# Patient Record
Sex: Female | Born: 1985 | Race: Black or African American | Hispanic: No | Marital: Single | Smoking: Never smoker
Health system: Southern US, Community
[De-identification: ages and names within clinical notes are randomized; demographics above are authoritative.]

## PROBLEM LIST (undated history)

## (undated) DIAGNOSIS — D6859 Other primary thrombophilia: Secondary | ICD-10-CM

## (undated) DIAGNOSIS — I10 Essential (primary) hypertension: Secondary | ICD-10-CM

## (undated) DIAGNOSIS — J45909 Unspecified asthma, uncomplicated: Secondary | ICD-10-CM

## (undated) HISTORY — PX: NO PAST SURGERIES: SHX2092

## (undated) HISTORY — PX: TUBAL LIGATION: SHX77

---
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
## 2012-07-31 DIAGNOSIS — D6859 Other primary thrombophilia: Secondary | ICD-10-CM | POA: Insufficient documentation

## 2013-06-10 DIAGNOSIS — J4520 Mild intermittent asthma, uncomplicated: Secondary | ICD-10-CM | POA: Insufficient documentation

## 2014-08-02 DIAGNOSIS — I10 Essential (primary) hypertension: Secondary | ICD-10-CM | POA: Insufficient documentation

## 2014-08-29 DIAGNOSIS — O1490 Unspecified pre-eclampsia, unspecified trimester: Secondary | ICD-10-CM

## 2016-02-16 DIAGNOSIS — Z6841 Body Mass Index (BMI) 40.0 and over, adult: Secondary | ICD-10-CM | POA: Insufficient documentation

## 2016-12-11 DIAGNOSIS — G43009 Migraine without aura, not intractable, without status migrainosus: Secondary | ICD-10-CM | POA: Insufficient documentation

## 2016-12-17 DIAGNOSIS — G4733 Obstructive sleep apnea (adult) (pediatric): Secondary | ICD-10-CM | POA: Insufficient documentation

## 2017-10-08 DIAGNOSIS — R3129 Other microscopic hematuria: Secondary | ICD-10-CM | POA: Insufficient documentation

## 2018-03-14 DIAGNOSIS — E876 Hypokalemia: Secondary | ICD-10-CM | POA: Insufficient documentation

## 2018-03-27 DIAGNOSIS — Z8659 Personal history of other mental and behavioral disorders: Secondary | ICD-10-CM | POA: Insufficient documentation

## 2018-04-04 DIAGNOSIS — Z6791 Unspecified blood type, Rh negative: Secondary | ICD-10-CM | POA: Insufficient documentation

## 2018-04-04 DIAGNOSIS — O26899 Other specified pregnancy related conditions, unspecified trimester: Secondary | ICD-10-CM | POA: Insufficient documentation

## 2018-05-13 DIAGNOSIS — O99119 Other diseases of the blood and blood-forming organs and certain disorders involving the immune mechanism complicating pregnancy, unspecified trimester: Secondary | ICD-10-CM | POA: Insufficient documentation

## 2018-05-13 DIAGNOSIS — Z8742 Personal history of other diseases of the female genital tract: Secondary | ICD-10-CM | POA: Insufficient documentation

## 2018-05-13 DIAGNOSIS — O99281 Endocrine, nutritional and metabolic diseases complicating pregnancy, first trimester: Secondary | ICD-10-CM | POA: Insufficient documentation

## 2018-05-13 DIAGNOSIS — J45909 Unspecified asthma, uncomplicated: Secondary | ICD-10-CM | POA: Insufficient documentation

## 2018-05-13 DIAGNOSIS — Z9151 Personal history of suicidal behavior: Secondary | ICD-10-CM | POA: Insufficient documentation

## 2018-05-13 DIAGNOSIS — O09291 Supervision of pregnancy with other poor reproductive or obstetric history, first trimester: Secondary | ICD-10-CM | POA: Insufficient documentation

## 2018-05-13 DIAGNOSIS — Z8619 Personal history of other infectious and parasitic diseases: Secondary | ICD-10-CM | POA: Insufficient documentation

## 2018-05-13 DIAGNOSIS — O10919 Unspecified pre-existing hypertension complicating pregnancy, unspecified trimester: Secondary | ICD-10-CM | POA: Insufficient documentation

## 2018-05-13 DIAGNOSIS — Z6791 Unspecified blood type, Rh negative: Secondary | ICD-10-CM | POA: Insufficient documentation

## 2018-05-13 DIAGNOSIS — E039 Hypothyroidism, unspecified: Secondary | ICD-10-CM | POA: Insufficient documentation

## 2018-05-13 DIAGNOSIS — O09891 Supervision of other high risk pregnancies, first trimester: Secondary | ICD-10-CM | POA: Insufficient documentation

## 2018-05-21 DIAGNOSIS — Z315 Encounter for genetic counseling: Secondary | ICD-10-CM | POA: Insufficient documentation

## 2018-09-26 ENCOUNTER — Encounter: Payer: Self-pay | Admitting: Emergency Medicine

## 2018-09-26 DIAGNOSIS — O26893 Other specified pregnancy related conditions, third trimester: Secondary | ICD-10-CM | POA: Insufficient documentation

## 2018-09-26 DIAGNOSIS — E860 Dehydration: Secondary | ICD-10-CM | POA: Diagnosis present

## 2018-09-26 DIAGNOSIS — J45909 Unspecified asthma, uncomplicated: Secondary | ICD-10-CM | POA: Insufficient documentation

## 2018-09-26 DIAGNOSIS — O212 Late vomiting of pregnancy: Secondary | ICD-10-CM | POA: Diagnosis present

## 2018-09-26 DIAGNOSIS — O99513 Diseases of the respiratory system complicating pregnancy, third trimester: Secondary | ICD-10-CM | POA: Insufficient documentation

## 2018-09-26 DIAGNOSIS — D6859 Other primary thrombophilia: Secondary | ICD-10-CM | POA: Diagnosis not present

## 2018-09-26 DIAGNOSIS — Z3A30 30 weeks gestation of pregnancy: Secondary | ICD-10-CM | POA: Insufficient documentation

## 2018-09-26 DIAGNOSIS — O163 Unspecified maternal hypertension, third trimester: Secondary | ICD-10-CM | POA: Diagnosis not present

## 2018-09-26 LAB — COMPREHENSIVE METABOLIC PANEL
ALT: 22 U/L (ref 0–44)
AST: 24 U/L (ref 15–41)
Albumin: 3.2 g/dL — ABNORMAL LOW (ref 3.5–5.0)
Alkaline Phosphatase: 95 U/L (ref 38–126)
Anion gap: 10 (ref 5–15)
BUN: 5 mg/dL — ABNORMAL LOW (ref 6–20)
CO2: 18 mmol/L — ABNORMAL LOW (ref 22–32)
Calcium: 9 mg/dL (ref 8.9–10.3)
Chloride: 107 mmol/L (ref 98–111)
Creatinine, Ser: 0.5 mg/dL (ref 0.44–1.00)
GFR calc Af Amer: >60 mL/min (ref >60)
GFR calc non Af Amer: >60 mL/min (ref >60)
Glucose, Bld: 114 mg/dL — ABNORMAL HIGH (ref 70–99)
Potassium: 2.9 mmol/L — ABNORMAL LOW (ref 3.5–5.1)
Sodium: 135 mmol/L (ref 135–145)
Total Bilirubin: 1.2 mg/dL (ref 0.3–1.2)
Total Protein: 6.9 g/dL (ref 6.5–8.1)

## 2018-09-26 LAB — CBC
HCT: 35.5 % — ABNORMAL LOW (ref 36.0–46.0)
Hemoglobin: 12.2 g/dL (ref 12.0–15.0)
MCH: 30.3 pg (ref 26.0–34.0)
MCHC: 34.4 g/dL (ref 30.0–36.0)
MCV: 88.1 fL (ref 80.0–100.0)
Platelets: 329 10*3/uL (ref 150–400)
RBC: 4.03 MIL/uL (ref 3.87–5.11)
RDW: 12.6 % (ref 11.5–15.5)
WBC: 7.8 10*3/uL (ref 4.0–10.5)
nRBC: 0 % (ref 0.0–0.2)

## 2018-09-26 LAB — URINALYSIS, COMPLETE (UACMP) WITH MICROSCOPIC
Bilirubin Urine: NEGATIVE
Glucose, UA: NEGATIVE mg/dL
Ketones, ur: 20 mg/dL — AB
Leukocytes,Ua: NEGATIVE
Nitrite: NEGATIVE
Protein, ur: 30 mg/dL — AB
Specific Gravity, Urine: 1.026 (ref 1.005–1.030)
pH: 5 (ref 5.0–8.0)

## 2018-09-26 LAB — LIPASE, BLOOD: LIPASE: 20 U/L (ref 11–51)

## 2018-09-26 NOTE — ED Notes (Signed)
Patient ambulatory to stat desk without distress or difficulty.  Reports here because having diarrhea.  Patient is [redacted] weeks pregnant, has no pregnancy complaint.

## 2018-09-26 NOTE — ED Triage Notes (Signed)
Patient c/o N/V/D beginning last night. Patient reports multiple emeses and liquid stool. Patient is pregnant, EDD is 5/8.

## 2018-09-26 NOTE — ED Notes (Signed)
This RN spoke with L&D. After patient is medically cleared she is to be sent to Obs 2, and will see MD Stabler.

## 2018-09-27 ENCOUNTER — Observation Stay
Admission: EM | Admit: 2018-09-27 | Discharge: 2018-09-27 | Disposition: A | Discharge status: Home / Self Care | Payer: BC Managed Care – PPO | Attending: Certified Nurse Midwife | Admitting: Certified Nurse Midwife

## 2018-09-27 ENCOUNTER — Other Ambulatory Visit: Payer: Self-pay

## 2018-09-27 ENCOUNTER — Encounter: Payer: Self-pay | Admitting: *Deleted

## 2018-09-27 DIAGNOSIS — R102 Pelvic and perineal pain unspecified side: Secondary | ICD-10-CM | POA: Diagnosis present

## 2018-09-27 DIAGNOSIS — O26893 Other specified pregnancy related conditions, third trimester: Secondary | ICD-10-CM

## 2018-09-27 DIAGNOSIS — Z3A30 30 weeks gestation of pregnancy: Secondary | ICD-10-CM | POA: Diagnosis not present

## 2018-09-27 DIAGNOSIS — O212 Late vomiting of pregnancy: Secondary | ICD-10-CM | POA: Diagnosis not present

## 2018-09-27 DIAGNOSIS — R197 Diarrhea, unspecified: Secondary | ICD-10-CM

## 2018-09-27 DIAGNOSIS — E860 Dehydration: Secondary | ICD-10-CM

## 2018-09-27 DIAGNOSIS — N949 Unspecified condition associated with female genital organs and menstrual cycle: Secondary | ICD-10-CM | POA: Diagnosis present

## 2018-09-27 DIAGNOSIS — E876 Hypokalemia: Secondary | ICD-10-CM

## 2018-09-27 DIAGNOSIS — R112 Nausea with vomiting, unspecified: Secondary | ICD-10-CM

## 2018-09-27 DIAGNOSIS — O10913 Unspecified pre-existing hypertension complicating pregnancy, third trimester: Secondary | ICD-10-CM | POA: Diagnosis not present

## 2018-09-27 HISTORY — DX: Unspecified asthma, uncomplicated: J45.909

## 2018-09-27 HISTORY — DX: Other primary thrombophilia: D68.59

## 2018-09-27 HISTORY — DX: Essential (primary) hypertension: I10

## 2018-09-27 MED ORDER — ONDANSETRON HCL 4 MG/2ML IJ SOLN
4.0000 mg | Freq: Once | INTRAMUSCULAR | Status: AC
  Administered 2018-09-27: 4 mg via INTRAVENOUS
  Filled 2018-09-27: qty 2

## 2018-09-27 MED ORDER — POTASSIUM CHLORIDE 20 MEQ PO PACK
40.0000 meq | PACK | Freq: Once | ORAL | Status: AC
  Administered 2018-09-27: 40 meq via ORAL

## 2018-09-27 MED ORDER — FAMOTIDINE 20 MG PO TABS: 20.0000 mg | ORAL_TABLET | Freq: Two times a day (BID) | ORAL | 0 refills | Status: DC | PRN

## 2018-09-27 MED ORDER — POTASSIUM CHLORIDE CRYS ER 20 MEQ PO TBCR
40.0000 meq | EXTENDED_RELEASE_TABLET | Freq: Once | ORAL | Status: DC
  Filled 2018-09-27: qty 2

## 2018-09-27 MED ORDER — SODIUM CHLORIDE 0.9 % IV BOLUS: 1000.0000 mL | Freq: Once | INTRAVENOUS | Status: DC

## 2018-09-27 MED ORDER — SODIUM CHLORIDE 0.9 % IV BOLUS
1000.0000 mL | Freq: Once | INTRAVENOUS | Status: AC
  Administered 2018-09-27: 1000 mL via INTRAVENOUS

## 2018-09-27 MED ORDER — FAMOTIDINE 20 MG PO TABS
20.0000 mg | ORAL_TABLET | Freq: Two times a day (BID) | ORAL | Status: DC | PRN
  Administered 2018-09-27: 20 mg via ORAL
  Filled 2018-09-27: qty 1

## 2018-09-27 MED ORDER — DEXTROSE-NACL 5-0.45 % IV SOLN
Freq: Once | INTRAVENOUS | Status: AC
  Administered 2018-09-27: 04:00:00 via INTRAVENOUS

## 2018-09-27 MED ORDER — ONDANSETRON 4 MG PO TBDP: 4.0000 mg | ORAL_TABLET | Freq: Three times a day (TID) | ORAL | 0 refills | Status: AC | PRN

## 2018-09-27 MED ORDER — METRONIDAZOLE 0.75 % VA GEL: VAGINAL | 0 refills | Status: DC

## 2018-09-27 NOTE — Discharge Instructions (Addendum)
1.  You may take Zofran as needed for nausea/vomiting. 2.  Clear liquids x12 hours, then [employer] x3 days, then slowly advance diet as tolerated. 3.  Return to the ER for worsening symptoms, persistent vomiting, abdominal pain, vaginal bleeding, difficulty breathing or other concerns. 4. Call your OB/GYN and ask them to schedule a NST within the next week.

## 2018-09-27 NOTE — Final Progress Note (Signed)
Physician Final Progress Note  Patient ID: Catherine Waters MRN: [id number] DOB/AGE: 31/22/1987 31 y.o.  Admit date: 09/27/2018 Admitting provider: Gae Dry, MD/ Jesus Genera. Danise Mina, CNM Discharge date: 09/27/2018   Admission Diagnoses: Pelvic pressure at 30wk1d S/p treatment for nausea/vomiting/diarrhea in the ED CHTN in pregnancy Protein C deficiency Discharge Diagnoses: IUP at 30wk1d Pelvic pressure in the third trimester-resolved FHR deceleration-reactive NST with normal AFI  Consults: None  Significant Findings/ Diagnostic Studies: Catherine Waters is a 33 y.o. female G2, P60 with EDC=12/05/2018 who presented at [redacted] weeks gestation to the ED from home with a chief complaint of nausea/vomiting/diarrhea. She received 2 liters of fluid and 4 mgm Zofran IV as well as oral KCL for mild hypokalemia. She was then transported to L&D for evaluation of pelvic pressure she had complained of last night in the ER.  Prenatal care at [facility] remarkable for asthma, chronic hypertension ( on Procardia 60 mgm XL), Protein C deficiency, morbid obesity, RH negative (had Rhogam 9/3 due to vaginal bleeding and on 2/10)  and sleep apnea.  IN L&D patient reported that the pelvic pressure resolved when the vomiting stopped. Denied any vaginal bleeding or leakage of water. Baby has been active. Does currently have some heartburn She reports not being able to keep down the Flagyl she was prescribed for treatment of BV.  Exam: BP 119/87 (BP Location: Left Arm)   Pulse (!) 110   Temp (!) 97.5 F (36.4 C) (Oral)   Resp 16   Ht 5\' 3"  (1.6 m)   Wt 108.9 kg   SpO2 97%   BMI 42.51 kg/m   General: [name], appears tired, but in NAD Abdomen: soft, NT. gravid There was a deceleration of the fetal heart rate from the baseline of 150 to 70 BPM x 60 sec that was unprovoked. An AFI was 9.6cm. The fetal heart rate was monitored for another 2 hours. The tracing was reactive with a baseline of 145 and accelerations to  160s with moderate variability. There were no further decelerations seen. Toco: There was some irritability noted initially, which resolved with po hydration.  Cervix was closed/long/OOP.  She was able to tolerate clear liquids and had 2 liquid stools.  Patient was discharged home on clear liquids to advance to BRAT diet then diet as tolerated Recommend following up with her OB/GYN for a NST next week.  RX for Pepcid 20 mgm BID prn Metrogel-one applicatorful vaginally qhs x 5 She also has an RX for Zofran from the ER   Procedures: AFI On ultrasound: Presentation: cephalic Placenta: anterior Stomach and bladder visualized AFI: 30.62mm +13.31mm+19.3mm+32.2 mm=95.63mm  Discharge Condition: improved/ stable  Disposition: Discharge disposition: 01-Home or Self Care       Diet: Clear liquid diet, then advance to a BRAT diet x 3 days then advance as tolerated  Discharge Activity: Activity as tolerated   Allergies as of 09/27/2018      Reactions   Minocycline       Medication List    STOP taking these medications   metroNIDAZOLE 500 MG tablet Commonly known as:  FLAGYL     TAKE these medications   albuterol 108 (90 Base) MCG/ACT inhaler Commonly known as:  PROVENTIL HFA;VENTOLIN HFA Inhale into the lungs every 6 (six) hours as needed for wheezing or shortness of breath.   aspirin EC 81 MG tablet Take 81 mg by mouth daily.   famotidine 20 MG tablet Commonly known as:  PEPCID Take 1 tablet (20  mg total) by mouth 2 (two) times daily as needed for heartburn or indigestion.   loratadine 10 MG tablet Commonly known as:  CLARITIN Take 10 mg by mouth daily.   metroNIDAZOLE 0.75 % vaginal gel Commonly known as:  METROGEL Insert one applicatorful qhs x 5days   multivitamin-prenatal 27-0.8 MG Tabs tablet Take 1 tablet by mouth daily at 12 noon.   NIFEdipine 60 MG 24 hr tablet Commonly known as:  ADALAT CC Take 60 mg by mouth daily.   ondansetron 4 MG disintegrating  tablet Commonly known as:  ZOFRAN ODT Take 1 tablet (4 mg total) by mouth every 8 (eight) hours as needed for nausea or vomiting.      Follow-up Information    Your OB/GYN. Schedule an appointment as soon as possible for a visit in 1 week(s).   Why:  recommend having a non stress test          Total time spent taking care of this patient: 20 minutes  Signed: Dalia Heading 09/27/2018, 10:56 AM

## 2018-09-27 NOTE — ED Notes (Signed)
IV will remain in when patient transfer to L&D for fetal monitoring.

## 2018-09-27 NOTE — OB Triage Note (Signed)
Discharged home. Left floor ambulatory. Catherine Waters

## 2018-09-27 NOTE — OB Triage Note (Addendum)
"  Pelvic pain" started Friday around 1100 am. Currently not feeling any pelvic pain. Reports good fetal movement. Denies vaginal bleeding. Denies LOF. Reports "yeasty like discharge" Denies vaginal itching. Catherine Waters

## 2018-09-27 NOTE — ED Provider Notes (Signed)
[facility] Emergency Department Provider Note   ____________________________________________   First MD Initiated Contact with Patient 09/27/18 [phone]     (approximate)  I have reviewed the triage vital signs and the nursing notes.   HISTORY  Chief Complaint Diarrhea and Emesis    HPI Catherine Waters is a 33 y.o. female G2, P1 approximately [redacted] weeks pregnant followed at [facility] who presents to the ED from home with a chief complaint of nausea/vomiting/diarrhea.  Symptoms onset 2 nights ago.  Patient reports multiple episodes of vomiting and diarrhea.   Last emesis around 3 PM.  Last diarrhea prior to arrival.  Denies abdominal pain or vaginal bleeding.  Denies associated fever, chills, chest pain, shortness of breath, dysuria.  Denies recent travel, trauma or antibiotic use.  Chart review indicates patient was seen at outside emergency department on 09/17/2022 viral syndrome.  She tested negative for influenza.   Past Medical History:  Diagnosis Date  . Asthma   . Hypertension     There are no active problems to display for this patient.   History reviewed. No pertinent surgical history.  Prior to Admission medications   Medication Sig Start Date End Date Taking? Authorizing Provider  ondansetron (ZOFRAN ODT) 4 MG disintegrating tablet Take 1 tablet (4 mg total) by mouth every 8 (eight) hours as needed for nausea or vomiting. 09/27/18   Paulette Blanch, MD    Allergies Minocycline  No family history on file.  Social History Social History   Tobacco Use  . Smoking status: Never Smoker  . Smokeless tobacco: Never Used  Substance Use Topics  . Alcohol use: Not Currently  . Drug use: Not on file    Review of Systems  Constitutional: No fever/chills Eyes: No visual changes. ENT: No sore throat. Cardiovascular: Denies chest pain. Respiratory: Denies shortness of breath. Gastrointestinal: No abdominal pain.  Positive for nausea, vomiting and  diarrhea.  No constipation. Genitourinary: Negative for dysuria. Musculoskeletal: Negative for back pain. Skin: Negative for rash. Neurological: Negative for headaches, focal weakness or numbness.   ____________________________________________   PHYSICAL EXAM:  VITAL SIGNS: ED Triage Vitals  Enc Vitals Group     BP 09/26/18 2238 122/88     Pulse Rate 09/26/18 2238 (!) 140     Resp 09/26/18 2238 20     Temp 09/26/18 2238 99.1 F (37.3 C)     Temp Source 09/26/18 2238 Oral     SpO2 09/26/18 2238 98 %     Weight 09/26/18 2242 240 lb (108.9 kg)     Height 09/26/18 2242 5\' 3"  (1.6 m)     Head Circumference --      Peak Flow --      Pain Score 09/26/18 2239 10     Pain Loc --      Pain Edu? --      Excl. in [place]? --     Constitutional: Alert and oriented. Well appearing and in no acute distress. Eyes: Conjunctivae are normal. PERRL. EOMI. Head: Atraumatic. Nose: No congestion/rhinnorhea. Mouth/Throat: Mucous membranes are mildly dry.  Oropharynx non-erythematous. Neck: No stridor.   Cardiovascular: Normal rate, regular rhythm. Grossly normal heart sounds.  Good peripheral circulation. Respiratory: Normal respiratory effort.  No retractions. Lungs CTAB. Gastrointestinal: Gravid.  Soft and nontender to light or deep palpation. No distention. No abdominal bruits. No CVA tenderness. Musculoskeletal: No lower extremity tenderness nor edema.  No joint effusions. Neurologic:  Normal speech and language. No gross focal neurologic  deficits are appreciated. No gait instability. Skin:  Skin is warm, dry and intact. No rash noted. Psychiatric: Mood and affect are normal. Speech and behavior are normal.  ____________________________________________   LABS (all labs ordered are listed, but only abnormal results are displayed)  Labs Reviewed  COMPREHENSIVE METABOLIC PANEL - Abnormal; Notable for the following components:      Result Value   Potassium 2.9 (*)    CO2 18 (*)    Glucose,  Bld 114 (*)    BUN 5 (*)    Albumin 3.2 (*)    All other components within normal limits  CBC - Abnormal; Notable for the following components:   HCT 35.5 (*)    All other components within normal limits  URINALYSIS, COMPLETE (UACMP) WITH MICROSCOPIC - Abnormal; Notable for the following components:   Color, Urine AMBER (*)    APPearance CLEAR (*)    Hgb urine dipstick MODERATE (*)    Ketones, ur 20 (*)    Protein, ur 30 (*)    Bacteria, UA FEW (*)    All other components within normal limits  LIPASE, BLOOD   ____________________________________________  EKG  None ____________________________________________  RADIOLOGY  ED MD interpretation: None  Official radiology report(s): No results found.  ____________________________________________   PROCEDURES  Procedure(s) performed (including Critical Care):  Procedures   ____________________________________________   INITIAL IMPRESSION / ASSESSMENT AND PLAN / ED COURSE  As part of my medical decision making, I reviewed the following data within the [facility] notes reviewed and incorporated, Labs reviewed, Old chart reviewed and Notes from prior ED visits    33 year old female who is [redacted] weeks pregnant who presents with nausea/vomiting/diarrhea.  Laboratory results remarkable for mild hypokalemia and dehydration.  Will initiate D5 one half normal saline infusion, 4 mg IV Zofran for nausea and reassess.  We will try to replete potassium orally if possible.  Of note, patient will be going upstairs for baby monitoring after cleared from the ED.   Clinical Course as of Feb 29 0658  Sat Sep 27, 2018  0558 She feeling better.  Able to tolerate oral potassium.  Heart rate improving.  Will administer second liter IV fluid bolus before patient heads up to L&D.  Fetal heart rate 150s.   [JS]  8768 Second liter IV fluids infusing.  Patient may go up to L&D for fetal monitoring after this is finished.   Care signed out to Dr. Cinda Quest pending reassessment.   [JS]    Clinical Course User Index [JS] Paulette Blanch, MD     ____________________________________________   FINAL CLINICAL IMPRESSION(S) / ED DIAGNOSES  Final diagnoses:  Dehydration  Nausea vomiting and diarrhea  [redacted] weeks gestation of pregnancy  Hypokalemia     ED Discharge Orders         Ordered    ondansetron (ZOFRAN ODT) 4 MG disintegrating tablet  Every 8 hours PRN     09/27/18 0646           Note:  This document was prepared using Dragon voice recognition software and may include unintentional dictation errors.   Paulette Blanch, MD 09/27/18 [phone]

## 2019-02-27 ENCOUNTER — Encounter (HOSPITAL_COMMUNITY): Payer: Self-pay

## 2019-04-29 ENCOUNTER — Other Ambulatory Visit: Payer: Self-pay | Admitting: Certified Nurse Midwife

## 2020-02-15 ENCOUNTER — Other Ambulatory Visit: Payer: Self-pay

## 2020-02-15 ENCOUNTER — Ambulatory Visit
Admission: RE | Admit: 2020-02-15 | Discharge: 2020-02-15 | Disposition: A | Discharge status: Home / Self Care | Payer: BC Managed Care – PPO | Source: Ambulatory Visit | Attending: Family Medicine | Admitting: Family Medicine

## 2020-02-15 VITALS — BP 153/106 | HR 95 | Temp 98.6°F | Resp 18 | Ht 63.0 in | Wt 240.0 lb

## 2020-02-15 DIAGNOSIS — N898 Other specified noninflammatory disorders of vagina: Secondary | ICD-10-CM | POA: Insufficient documentation

## 2020-02-15 LAB — POCT URINALYSIS DIP (MANUAL ENTRY)
Bilirubin, UA: NEGATIVE
Glucose, UA: NEGATIVE mg/dL
Ketones, POC UA: NEGATIVE mg/dL
Nitrite, UA: NEGATIVE
Protein Ur, POC: NEGATIVE mg/dL
Spec Grav, UA: 1.02 (ref 1.010–1.025)
Urobilinogen, UA: 0.2 E.U./dL
pH, UA: 6.5 (ref 5.0–8.0)

## 2020-02-15 MED ORDER — FLUCONAZOLE 200 MG PO TABS: 200.0000 mg | ORAL_TABLET | Freq: Once | ORAL | 0 refills | Status: AC

## 2020-02-15 NOTE — Discharge Instructions (Addendum)
I have sent in fluconazole for you to take for yeast. Take one tablet today, and if you are still having symptoms in 3 days, take the second tablet  Follow up as needed with primary care

## 2020-02-15 NOTE — ED Provider Notes (Signed)
[facility]   [id number] 02/15/20 Arrival Time: 0900   CC: VAGINAL DISCHARGE  SUBJECTIVE:  Catherine Waters is a 34 y.o. female who presents with complaints of gradual white vaginal discharge that began about 5 days ago.  Reports that she took antibiotics about 2 weeks ago.  Reports that every time she takes antibiotic, she gets yeast infection.  Reports that her vagina is also been a bit irritated.  Has not taken OTC medications for this.  Denies any odor. She denies fever, chills, nausea, vomiting, abdominal or pelvic pain, urinary symptoms, vaginal itching, vaginal odor, vaginal bleeding, dyspareunia, vaginal rashes or lesions.   Patient's last menstrual period was 01/20/2020.   ROS: As per HPI.  All other pertinent ROS negative.     Past Medical History:  Diagnosis Date  . Asthma   . Hypertension   . Protein C deficiency [facility])    Past Surgical History:  Procedure Laterality Date  . NO PAST SURGERIES    . TUBAL LIGATION     Allergies  Allergen Reactions  . Minocycline    No current facility-administered medications on file prior to encounter.   Current Outpatient Medications on File Prior to Encounter  Medication Sig Dispense Refill  . albuterol (PROVENTIL HFA;VENTOLIN HFA) 108 (90 Base) MCG/ACT inhaler Inhale into the lungs every 6 (six) hours as needed for wheezing or shortness of breath.    Marland Kitchen aspirin EC 81 MG tablet Take 81 mg by mouth daily.    . famotidine (PEPCID) 20 MG tablet Take 1 tablet (20 mg total) by mouth 2 (two) times daily as needed for heartburn or indigestion. 60 tablet 0  . loratadine (CLARITIN) 10 MG tablet Take 10 mg by mouth daily.    . metroNIDAZOLE (METROGEL) 0.75 % vaginal gel Insert one applicatorful qhs x 5days 70 g 0  . NIFEdipine (ADALAT CC) 60 MG 24 hr tablet Take 60 mg by mouth daily.    . ondansetron (ZOFRAN ODT) 4 MG disintegrating tablet Take 1 tablet (4 mg total) by mouth every 8 (eight) hours as needed for nausea or vomiting. 20  tablet 0  . Prenatal Vit-Fe Fumarate-FA (MULTIVITAMIN-PRENATAL) 27-0.8 MG TABS tablet Take 1 tablet by mouth daily at 12 noon.      Social History   Socioeconomic History  . Marital status: Single    Spouse name: Not on file  . Number of children: Not on file  . Years of education: Not on file  . Highest education level: Not on file  Occupational History  . Not on file  Tobacco Use  . Smoking status: Never Smoker  . Smokeless tobacco: Never Used  Substance and Sexual Activity  . Alcohol use: Not Currently  . Drug use: Never  . Sexual activity: Yes    Birth control/protection: Surgical  Other Topics Concern  . Not on file  Social History Narrative  . Not on file   Social Determinants of Health   Financial Resource Strain:   . Difficulty of Paying Living Expenses:   Food Insecurity:   . Worried About Charity fundraiser in the Last Year:   . Arboriculturist in the Last Year:   Transportation Needs:   . Film/video editor (Medical):   Marland Kitchen Lack of Transportation (Non-Medical):   Physical Activity:   . Days of Exercise per Week:   . Minutes of Exercise per Session:   Stress:   . Feeling of Stress :   Social Connections:   .  Frequency of Communication with Friends and Family:   . Frequency of Social Gatherings with Friends and Family:   . Attends Religious Services:   . Active Member of Clubs or Organizations:   . Attends Archivist Meetings:   Marland Kitchen Marital Status:   Intimate Partner Violence:   . Fear of Current or Ex-Partner:   . Emotionally Abused:   Marland Kitchen Physically Abused:   . Sexually Abused:    No family history on file.  OBJECTIVE:  Vitals:   02/15/20 0914 02/15/20 0915  BP: (!) 153/106   Pulse: 95   Resp: 18   Temp: 98.6 F (37 C)   TempSrc: Oral   SpO2: 99%   Weight:  240 lb (108.9 kg)  Height:  5\' 3"  (1.6 m)     General appearance: Alert, NAD, appears stated age Head: NCAT Throat: lips, mucosa, and tongue normal; teeth and gums  normal Lungs: CTA bilaterally without adventitious breath sounds Heart: regular rate and rhythm.  Radial pulses 2+ symmetrical bilaterally Back: no CVA tenderness Abdomen: soft, non-tender; bowel sounds normal; no masses or organomegaly; no guarding or rebound tenderness GU: declines  Skin: warm and dry Psychological:  Alert and cooperative. Normal mood and affect.  LABS:  Results for orders placed or performed during the hospital encounter of 02/15/20  POCT urinalysis dipstick  Result Value Ref Range   Color, UA yellow yellow   Clarity, UA clear clear   Glucose, UA negative negative mg/dL   Bilirubin, UA negative negative   Ketones, POC UA negative negative mg/dL   Spec Grav, UA 1.020 1.010 - 1.025   Blood, UA small (A) negative   pH, UA 6.5 5.0 - 8.0   Protein Ur, POC negative negative mg/dL   Urobilinogen, UA 0.2 0.2 or 1.0 E.U./dL   Nitrite, UA Negative Negative   Leukocytes, UA Trace (A) Negative    Labs Reviewed  POCT URINALYSIS DIP (MANUAL ENTRY) - Abnormal; Notable for the following components:      Result Value   Blood, UA small (*)    Leukocytes, UA Trace (*)    All other components within normal limits  CERVICOVAGINAL ANCILLARY ONLY    ASSESSMENT & PLAN:  1. Vaginal irritation   2. Vaginal discharge     Meds ordered this encounter  Medications  . fluconazole (DIFLUCAN) 200 MG tablet    Sig: Take 1 tablet (200 mg total) by mouth once for 1 dose.    Dispense:  2 tablet    Refill:  0    Order Specific Question:   Supervising Provider    Answer:   Chase Picket [[id number]]    Pending: Labs Reviewed  POCT URINALYSIS DIP (MANUAL ENTRY) - Abnormal; Notable for the following components:      Result Value   Blood, UA small (*)    Leukocytes, UA Trace (*)    All other components within normal limits  CERVICOVAGINAL ANCILLARY ONLY    UA negative for infection Vaginal self-swab obtained.   We will follow up with you regarding abnormal  results Prescribed fluconazole If tests results are positive, please abstain from sexual activity until you and your partner(s) have been treated Follow up with PCP or [facility] if symptoms persists Return here or go to ER if you have any new or worsening symptoms fever, chills, nausea, vomiting, abdominal or pelvic pain, painful intercourse, vaginal discharge, vaginal bleeding, persistent symptoms despite treatment Reviewed expectations re: course of current medical issues. Questions answered.  Outlined signs and symptoms indicating need for more acute intervention. Patient verbalized understanding. After Visit Summary given.        Faustino Congress, NP 02/15/20 1111

## 2020-02-15 NOTE — ED Triage Notes (Signed)
Pt reports having vaginal discharge (white in color) and irritation when urinating.

## 2020-02-16 LAB — CERVICOVAGINAL ANCILLARY ONLY
Bacterial Vaginitis (gardnerella): NEGATIVE
Candida Glabrata: NEGATIVE
Candida Vaginitis: POSITIVE — AB
Chlamydia: NEGATIVE
Comment: NEGATIVE
Comment: NEGATIVE
Comment: NEGATIVE
Comment: NEGATIVE
Comment: NEGATIVE
Comment: NORMAL
Neisseria Gonorrhea: NEGATIVE
Trichomonas: NEGATIVE

## 2020-03-24 ENCOUNTER — Ambulatory Visit
Admission: EM | Admit: 2020-03-24 | Discharge: 2020-03-24 | Disposition: A | Discharge status: Home / Self Care | Payer: BC Managed Care – PPO

## 2020-03-24 ENCOUNTER — Other Ambulatory Visit: Payer: Self-pay

## 2020-03-24 DIAGNOSIS — H1012 Acute atopic conjunctivitis, left eye: Secondary | ICD-10-CM

## 2020-03-24 NOTE — ED Triage Notes (Signed)
Patient complains of eye itching and burning after waking up today. No OTC tx done prior to arrival at [facility].

## 2020-03-24 NOTE — ED Provider Notes (Signed)
[name]    CSN: [id number] Arrival date & time: 03/24/20  1700      History   Chief Complaint Chief Complaint  Patient presents with  . Allergies  . Eye Problem    HPI Catherine Waters is a 34 y.o. female.   Patient presents with left eye itching and burning since this morning.  She denies drainage or crusting.  No eye pain or changes in her vision.  No known injury.  No feeling of foreign body.  She denies fever, chills, cough, shortness of breath, abdominal pain, or other symptoms.  The history is provided by the patient.    Past Medical History:  Diagnosis Date  . Asthma   . Hypertension   . Protein C deficiency [facility])     Patient Active Problem List   Diagnosis Date Noted  . Pelvic pressure in pregnancy, antepartum, third trimester 09/27/2018    Past Surgical History:  Procedure Laterality Date  . NO PAST SURGERIES    . TUBAL LIGATION      OB History    Gravida  2   Para  1   Term      Preterm  1   AB      Living  1     SAB      TAB      Ectopic      Multiple      Live Births  1            Home Medications    Prior to Admission medications   Medication Sig Start Date End Date Taking? Authorizing Provider  albuterol (PROVENTIL HFA;VENTOLIN HFA) 108 (90 Base) MCG/ACT inhaler Inhale into the lungs every 6 (six) hours as needed for wheezing or shortness of breath.    [provider]  aspirin EC 81 MG tablet Take 81 mg by mouth daily.    [provider]  famotidine (PEPCID) 20 MG tablet Take 1 tablet (20 mg total) by mouth 2 (two) times daily as needed for heartburn or indigestion. 09/27/18   Dalia Heading, CNM  loratadine (CLARITIN) 10 MG tablet Take 10 mg by mouth daily.    [provider]  metroNIDAZOLE (METROGEL) 0.75 % vaginal gel Insert one applicatorful qhs x 5days 09/27/18   Dalia Heading, CNM  NIFEdipine (ADALAT CC) 60 MG 24 hr tablet Take 60 mg by mouth daily.    [provider]  ondansetron (ZOFRAN ODT) 4 MG disintegrating tablet Take 1 tablet (4 mg total) by mouth every 8 (eight) hours as needed for nausea or vomiting. 09/27/18   Paulette Blanch, MD  Prenatal Vit-Fe Fumarate-FA (MULTIVITAMIN-PRENATAL) 27-0.8 MG TABS tablet Take 1 tablet by mouth daily at 12 noon.    [provider]    Family History History reviewed. No pertinent family history.  Social History Social History   Tobacco Use  . Smoking status: Never Smoker  . Smokeless tobacco: Never Used  Substance Use Topics  . Alcohol use: Not Currently  . Drug use: Never     Allergies   Minocycline   Review of Systems Review of Systems  Constitutional: Negative for chills and fever.  HENT: Negative for ear pain and sore throat.   Eyes: Positive for itching. Negative for photophobia, pain, discharge, redness and visual disturbance.  Respiratory: Negative for cough and shortness of breath.   Cardiovascular: Negative for chest pain and palpitations.  Gastrointestinal: Negative for abdominal pain and vomiting.  Genitourinary: Negative for dysuria  and hematuria.  Musculoskeletal: Negative for arthralgias and back pain.  Skin: Negative for color change and rash.  Neurological: Negative for seizures and syncope.  All other systems reviewed and are negative.    Physical Exam Triage Vital Signs ED Triage Vitals  Enc Vitals Group     BP      Pulse      Resp      Temp      Temp src      SpO2      Weight      Height      Head Circumference      Peak Flow      Pain Score      Pain Loc      Pain Edu?      Excl. in [place]?    No data found.  Updated Vital Signs BP (!) 137/97   Pulse 90   Temp 99.2 F (37.3 C)   Resp 14   LMP 03/17/2020 (Within Days)   SpO2 99%   Visual Acuity Right Eye Distance:   Left Eye Distance:   Bilateral Distance:    Right Eye Near:   Left Eye Near:    Bilateral Near:     Physical Exam Vitals and nursing note reviewed.  Constitutional:       General: She is not in acute distress.    Appearance: She is well-developed. She is not ill-appearing.  HENT:     Head: Normocephalic and atraumatic.     Mouth/Throat:     Mouth: Mucous membranes are moist.  Eyes:     General:        Right eye: No discharge.        Left eye: No discharge.     Extraocular Movements: Extraocular movements intact.     Conjunctiva/sclera: Conjunctivae normal.     Pupils: Pupils are equal, round, and reactive to light.  Cardiovascular:     Rate and Rhythm: Normal rate and regular rhythm.     Heart sounds: No murmur heard.   Pulmonary:     Effort: Pulmonary effort is normal. No respiratory distress.     Breath sounds: Normal breath sounds.  Abdominal:     Palpations: Abdomen is soft.     Tenderness: There is no abdominal tenderness.  Musculoskeletal:     Cervical back: Neck supple.  Skin:    General: Skin is warm and dry.     Findings: No rash.  Neurological:     General: No focal deficit present.     Mental Status: She is alert and oriented to person, place, and time.     Gait: Gait normal.  Psychiatric:        Mood and Affect: Mood normal.        Behavior: Behavior normal.      UC Treatments / Results  Labs (all labs ordered are listed, but only abnormal results are displayed) Labs Reviewed - No data to display  EKG   Radiology No results found.  Procedures Procedures (including critical care time)  Medications Ordered in UC Medications - No data to display  Initial Impression / Assessment and Plan / UC Course  I have reviewed the triage vital signs and the nursing notes.  Pertinent labs & imaging results that were available during my care of the patient were reviewed by me and considered in my medical decision making (see chart for details).   Allergic conjunctivitis of the left eye.  Instructed patient to  use over-the-counter saline eyedrops as needed.  Instructed her to follow-up with her eye care provider for recheck in 1  to 2 days if her symptoms or not improving.  Instructed her to go to the ED if she has acute eye pain, changes in her vision, or other concerning symptoms.  Patient agrees to plan of care.     Final Clinical Impressions(s) / UC Diagnoses   Final diagnoses:  Allergic conjunctivitis of left eye     Discharge Instructions     Use over-the-counter saline eyedrops as needed.    Follow-up with your eye doctor for a recheck in 1 to 2 days if your symptoms are not improving.    Go to the emergency department if you have acute eye pain, changes in your vision, or other concerning symptoms.         ED Prescriptions    None     PDMP not reviewed this encounter.   Sharion Balloon, NP 03/24/20 1736

## 2020-03-24 NOTE — Discharge Instructions (Signed)
Use over-the-counter saline eyedrops as needed.    Follow-up with your eye doctor for a recheck in 1 to 2 days if your symptoms are not improving.    Go to the emergency department if you have acute eye pain, changes in your vision, or other concerning symptoms.

## 2020-04-21 ENCOUNTER — Ambulatory Visit
Admission: EM | Admit: 2020-04-21 | Discharge: 2020-04-21 | Disposition: A | Discharge status: Home / Self Care | Payer: BC Managed Care – PPO | Attending: Family Medicine | Admitting: Family Medicine

## 2020-04-21 DIAGNOSIS — R300 Dysuria: Secondary | ICD-10-CM

## 2020-04-21 DIAGNOSIS — N898 Other specified noninflammatory disorders of vagina: Secondary | ICD-10-CM | POA: Diagnosis present

## 2020-04-21 LAB — POCT URINALYSIS DIP (MANUAL ENTRY)
Glucose, UA: NEGATIVE mg/dL
Nitrite, UA: NEGATIVE
Protein Ur, POC: 100 mg/dL — AB
Spec Grav, UA: >=1.03 — AB (ref 1.010–1.025)
Urobilinogen, UA: 0.2 E.U./dL
pH, UA: 5.5 (ref 5.0–8.0)

## 2020-04-21 MED ORDER — CEFTRIAXONE SODIUM 500 MG IJ SOLR
500.0000 mg | Freq: Once | INTRAMUSCULAR | Status: AC
  Administered 2020-04-21: 500 mg via INTRAMUSCULAR

## 2020-04-21 MED ORDER — FLUCONAZOLE 200 MG PO TABS: 200.0000 mg | ORAL_TABLET | Freq: Once | ORAL | 0 refills | Status: AC

## 2020-04-21 MED ORDER — AZITHROMYCIN 500 MG PO TABS: 1000.0000 mg | ORAL_TABLET | Freq: Every day | ORAL | 0 refills | Status: DC

## 2020-04-21 MED ORDER — NITROFURANTOIN MONOHYD MACRO 100 MG PO CAPS
100.0000 mg | ORAL_CAPSULE | Freq: Two times a day (BID) | ORAL | 0 refills | Status: DC
Start: 2020-04-21 — End: 2020-11-23

## 2020-04-21 NOTE — ED Triage Notes (Signed)
Patient complains of vaginal discharge and burning with urination x1 week.

## 2020-04-21 NOTE — ED Provider Notes (Signed)
[facility]   CC: UTI  SUBJECTIVE:  Catherine Waters is a 34 y.o. female who complains of urinary frequency, urgency and dysuria for the past week. Reports that she is also experiencing thick yellow vaginal discharge with an odor. Reports that she got an IUD placed on 03/30/20. Reports that she is also feeling low abdominal pain and cramping at times. Has not attempted OTC treatment. Symptoms are made worse with urination. Admits to similar symptoms in the past.  Denies fever, chills, nausea, vomiting, flank pain, bleeding, hematuria.    LMP: Patient's last menstrual period was 04/12/2020 (exact date).  ROS: As in HPI.  All other pertinent ROS negative.     Past Medical History:  Diagnosis Date  . Asthma   . Hypertension   . Protein C deficiency [facility])    Past Surgical History:  Procedure Laterality Date  . NO PAST SURGERIES    . TUBAL LIGATION     Allergies  Allergen Reactions  . Minocycline    No current facility-administered medications on file prior to encounter.   Current Outpatient Medications on File Prior to Encounter  Medication Sig Dispense Refill  . loratadine (CLARITIN) 10 MG tablet Take 10 mg by mouth daily.    Marland Kitchen NIFEdipine (ADALAT CC) 60 MG 24 hr tablet Take 60 mg by mouth daily.    Marland Kitchen albuterol (PROVENTIL HFA;VENTOLIN HFA) 108 (90 Base) MCG/ACT inhaler Inhale into the lungs every 6 (six) hours as needed for wheezing or shortness of breath.    Marland Kitchen aspirin EC 81 MG tablet Take 81 mg by mouth daily.    . famotidine (PEPCID) 20 MG tablet Take 1 tablet (20 mg total) by mouth 2 (two) times daily as needed for heartburn or indigestion. 60 tablet 0  . metroNIDAZOLE (METROGEL) 0.75 % vaginal gel Insert one applicatorful qhs x 5days 70 g 0  . ondansetron (ZOFRAN ODT) 4 MG disintegrating tablet Take 1 tablet (4 mg total) by mouth every 8 (eight) hours as needed for nausea or vomiting. 20 tablet 0  . Prenatal Vit-Fe Fumarate-FA (MULTIVITAMIN-PRENATAL) 27-0.8 MG TABS  tablet Take 1 tablet by mouth daily at 12 noon.     Social History   Socioeconomic History  . Marital status: Single    Spouse name: Not on file  . Number of children: Not on file  . Years of education: Not on file  . Highest education level: Not on file  Occupational History  . Not on file  Tobacco Use  . Smoking status: Never Smoker  . Smokeless tobacco: Never Used  Substance and Sexual Activity  . Alcohol use: Not Currently  . Drug use: Never  . Sexual activity: Yes    Birth control/protection: Surgical  Other Topics Concern  . Not on file  Social History Narrative  . Not on file   Social Determinants of Health   Financial Resource Strain:   . Difficulty of Paying Living Expenses: Not on file  Food Insecurity:   . Worried About Charity fundraiser in the Last Year: Not on file  . Ran Out of Food in the Last Year: Not on file  Transportation Needs:   . Lack of Transportation (Medical): Not on file  . Lack of Transportation (Non-Medical): Not on file  Physical Activity:   . Days of Exercise per Week: Not on file  . Minutes of Exercise per Session: Not on file  Stress:   . Feeling of Stress : Not on file  Social Connections:   .  Frequency of Communication with Friends and Family: Not on file  . Frequency of Social Gatherings with Friends and Family: Not on file  . Attends Religious Services: Not on file  . Active Member of Clubs or Organizations: Not on file  . Attends Archivist Meetings: Not on file  . Marital Status: Not on file  Intimate Partner Violence:   . Fear of Current or Ex-Partner: Not on file  . Emotionally Abused: Not on file  . Physically Abused: Not on file  . Sexually Abused: Not on file   History reviewed. No pertinent family history.  OBJECTIVE:  Vitals:   04/21/20 1325  BP: (!) 138/99  Pulse: 100  Resp: 14  Temp: 99.4 F (37.4 C)  SpO2: 98%   General appearance: AOx3 in no acute distress HEENT: NCAT. Oropharynx clear.    Lungs: clear to auscultation bilaterally without adventitious breath sounds Heart: regular rate and rhythm. Radial pulses 2+ symmetrical bilaterally Abdomen: soft; non-distended; no tenderness; bowel sounds present; no guarding or rebound tenderness Back: no CVA tenderness GU: deferred Extremities: no edema; symmetrical with no gross deformities Skin: warm and dry Neurologic: Ambulates from chair to exam table without difficulty Psychological: alert and cooperative; normal mood and affect  Labs Reviewed  POCT URINALYSIS DIP (MANUAL ENTRY) - Abnormal; Notable for the following components:      Result Value   Clarity, UA cloudy (*)    Bilirubin, UA small (*)    Ketones, POC UA small (15) (*)    Spec Grav, UA >=1.030 (*)    Blood, UA large (*)    Protein Ur, POC =100 (*)    Leukocytes, UA Small (1+) (*)    All other components within normal limits  URINE CULTURE  CERVICOVAGINAL ANCILLARY ONLY    ASSESSMENT & PLAN:  1. Dysuria   2. Vaginal discharge   3. Vaginal odor     Meds ordered this encounter  Medications  . azithromycin (ZITHROMAX) 500 MG tablet    Sig: Take 2 tablets (1,000 mg total) by mouth daily.    Dispense:  2 tablet    Refill:  0    Order Specific Question:   Supervising Provider    Answer:   Chase Picket [id number]  . nitrofurantoin, macrocrystal-monohydrate, (MACROBID) 100 MG capsule    Sig: Take 1 capsule (100 mg total) by mouth 2 (two) times daily.    Dispense:  10 capsule    Refill:  0    Order Specific Question:   Supervising Provider    Answer:   Chase Picket [id number]  . fluconazole (DIFLUCAN) 200 MG tablet    Sig: Take 1 tablet (200 mg total) by mouth once for 1 dose.    Dispense:  2 tablet    Refill:  0    Order Specific Question:   Supervising Provider    Answer:   Chase Picket [id number]  . cefTRIAXone (ROCEPHIN) injection 500 mg   Macrobid prescribed Azithromycin prescribed Fluconazole prescribed Rocephin 500mg  IM in  office today Will treat for UTI and PID Vaginal swab results will be back in about 2 days Urine culture sent  We will call you with abnormal results that need further treatment Push fluids and get plenty of rest Take antibiotics as directed and to completion Take pyridium as prescribed and as needed for symptomatic relief Follow up with PCP if symptoms persists Return here or go to ER if you have any new or worsening symptoms such as  fever, worsening abdominal pain, nausea/vomiting, flank pain  Outlined signs and symptoms indicating need for more acute intervention Patient verbalized understanding After Visit Summary given     Faustino Congress, NP 04/21/20 1351

## 2020-04-21 NOTE — Discharge Instructions (Addendum)
I have sent in [place] for you to take twice a day for 5 days  I have also sent in azithromycin for you to take 2 tablets one time  I have sent in fluconazole for yeast. Take one tablet at the onset of symptoms. If symptoms are still present after 3 days, take the 2nd tablet.  You have received an antibiotic injection in the office today  We will culture your urine and will be in touch with abnormal results that may need further treatment  Your swab tests will be back in about 2 days. We will be in contact with abnormal results that need further treatment  Follow up as needed

## 2020-04-22 LAB — URINE CULTURE: Culture: >=100000 — AB

## 2020-04-23 ENCOUNTER — Other Ambulatory Visit: Payer: Self-pay

## 2020-04-23 ENCOUNTER — Encounter: Payer: Self-pay | Admitting: Emergency Medicine

## 2020-04-23 ENCOUNTER — Emergency Department: Payer: BC Managed Care – PPO

## 2020-04-23 ENCOUNTER — Emergency Department
Admission: EM | Admit: 2020-04-23 | Discharge: 2020-04-23 | Disposition: A | Discharge status: Home / Self Care | Payer: BC Managed Care – PPO | Attending: Emergency Medicine | Admitting: Emergency Medicine

## 2020-04-23 DIAGNOSIS — Z5321 Procedure and treatment not carried out due to patient leaving prior to being seen by health care provider: Secondary | ICD-10-CM | POA: Insufficient documentation

## 2020-04-23 DIAGNOSIS — R079 Chest pain, unspecified: Secondary | ICD-10-CM | POA: Insufficient documentation

## 2020-04-23 LAB — CBC
HCT: 38 % (ref 36.0–46.0)
Hemoglobin: 12.6 g/dL (ref 12.0–15.0)
MCH: 29.5 pg (ref 26.0–34.0)
MCHC: 33.2 g/dL (ref 30.0–36.0)
MCV: 89 fL (ref 80.0–100.0)
Platelets: 326 10*3/uL (ref 150–400)
RBC: 4.27 MIL/uL (ref 3.87–5.11)
RDW: 12.9 % (ref 11.5–15.5)
WBC: 12.2 10*3/uL — ABNORMAL HIGH (ref 4.0–10.5)
nRBC: 0 % (ref 0.0–0.2)

## 2020-04-23 LAB — BASIC METABOLIC PANEL
Anion gap: 10 (ref 5–15)
BUN: 14 mg/dL (ref 6–20)
CO2: 23 mmol/L (ref 22–32)
Calcium: 9.1 mg/dL (ref 8.9–10.3)
Chloride: 105 mmol/L (ref 98–111)
Creatinine, Ser: 0.67 mg/dL (ref 0.44–1.00)
GFR calc Af Amer: >60 mL/min (ref >60)
GFR calc non Af Amer: >60 mL/min (ref >60)
Glucose, Bld: 101 mg/dL — ABNORMAL HIGH (ref 70–99)
Potassium: 3.2 mmol/L — ABNORMAL LOW (ref 3.5–5.1)
Sodium: 138 mmol/L (ref 135–145)

## 2020-04-23 LAB — TROPONIN I (HIGH SENSITIVITY)
Troponin I (High Sensitivity): 3 ng/L (ref <18)
Troponin I (High Sensitivity): 5 ng/L (ref <18)

## 2020-04-23 NOTE — ED Notes (Signed)
Pt 1st call at 11:53

## 2020-04-23 NOTE — ED Triage Notes (Signed)
Pt to ED via POV c/o chest pain that started 2-3 hours ago while she was at work. Pt states that she was sitting at her desk when the pain started. Pt denies cardiac hx/o. Pt denies any other symptoms  at this time. Pt is in NAD

## 2020-04-23 NOTE — ED Notes (Signed)
Pt noted to be falling asleep during triage

## 2020-04-25 LAB — CERVICOVAGINAL ANCILLARY ONLY
Bacterial Vaginitis (gardnerella): NEGATIVE
Candida Glabrata: NEGATIVE
Candida Vaginitis: NEGATIVE
Chlamydia: NEGATIVE
Comment: NEGATIVE
Comment: NEGATIVE
Comment: NEGATIVE
Comment: NEGATIVE
Comment: NEGATIVE
Comment: NORMAL
Neisseria Gonorrhea: NEGATIVE
Trichomonas: NEGATIVE

## 2020-11-23 ENCOUNTER — Other Ambulatory Visit: Payer: Self-pay

## 2020-11-23 ENCOUNTER — Encounter (HOSPITAL_COMMUNITY): Payer: Self-pay

## 2020-11-23 ENCOUNTER — Ambulatory Visit (HOSPITAL_COMMUNITY)
Admission: EM | Admit: 2020-11-23 | Discharge: 2020-11-23 | Disposition: A | Discharge status: Home / Self Care | Payer: BC Managed Care – PPO | Attending: Family Medicine | Admitting: Family Medicine

## 2020-11-23 DIAGNOSIS — I10 Essential (primary) hypertension: Secondary | ICD-10-CM | POA: Insufficient documentation

## 2020-11-23 DIAGNOSIS — N898 Other specified noninflammatory disorders of vagina: Secondary | ICD-10-CM | POA: Insufficient documentation

## 2020-11-23 LAB — POCT URINALYSIS DIPSTICK, ED / UC
Bilirubin Urine: NEGATIVE
Glucose, UA: NEGATIVE mg/dL
Ketones, ur: NEGATIVE mg/dL
Nitrite: NEGATIVE
Protein, ur: NEGATIVE mg/dL
Specific Gravity, Urine: 1.015 (ref 1.005–1.030)
Urobilinogen, UA: 0.2 mg/dL (ref 0.0–1.0)
pH: 7 (ref 5.0–8.0)

## 2020-11-23 MED ORDER — METRONIDAZOLE 500 MG PO TABS: 500.0000 mg | ORAL_TABLET | Freq: Two times a day (BID) | ORAL | 0 refills | Status: DC

## 2020-11-23 MED ORDER — FLUCONAZOLE 150 MG PO TABS: ORAL_TABLET | ORAL | 0 refills | Status: DC

## 2020-11-23 NOTE — ED Triage Notes (Signed)
Pt c/o vaginal discharge and itching x 1 week 

## 2020-11-23 NOTE — Discharge Instructions (Addendum)
We have sent testing for sexually transmitted infections. We will notify you of any positive results once they are received. If required, we will prescribe any medications you might need.  Please refrain from all sexual activity for at least the next seven days.  Meds ordered this encounter  Medications   fluconazole (DIFLUCAN) 150 MG tablet    Sig: Take one tablet by mouth as a single dose. May repeat in 3 days if symptoms persist.    Dispense:  2 tablet    Refill:  0   metroNIDAZOLE (FLAGYL) 500 MG tablet    Sig: Take 1 tablet (500 mg total) by mouth 2 (two) times daily.    Dispense:  14 tablet    Refill:  0   Your blood pressure was noted to be elevated during your visit today. If you are currently taking medication for high blood pressure, please ensure you are taking this as directed. If you do not have a history of high blood pressure and your blood pressure remains persistently elevated, you may need to begin taking a medication at some point. You may return here within the next few days to recheck if unable to see your primary care provider or if you do not have a one.  BP (!) 153/108   Pulse 88   Temp 98.2 F (36.8 C) (Oral)   Resp 18   LMP 11/09/2020 (Approximate)   SpO2 99%   BP Readings from Last 3 Encounters:  11/23/20 (!) 153/108  04/23/20 (!) 144/93  04/21/20 (!) 138/99

## 2020-11-24 LAB — CERVICOVAGINAL ANCILLARY ONLY
Bacterial Vaginitis (gardnerella): NEGATIVE
Candida Glabrata: NEGATIVE
Candida Vaginitis: POSITIVE — AB
Chlamydia: NEGATIVE
Comment: NEGATIVE
Comment: NEGATIVE
Comment: NEGATIVE
Comment: NEGATIVE
Comment: NEGATIVE
Comment: NORMAL
Neisseria Gonorrhea: NEGATIVE
Trichomonas: NEGATIVE

## 2020-11-24 NOTE — ED Provider Notes (Signed)
[place]   [id number] 11/23/20 Arrival Time: [place]:  1. Vaginal discharge   2. Vaginal itching   3. Elevated blood pressure reading in office with diagnosis of hypertension      Discharge Instructions      We have sent testing for sexually transmitted infections. We will notify you of any positive results once they are received. If required, we will prescribe any medications you might need.  Please refrain from all sexual activity for at least the next seven days.  Meds ordered this encounter  Medications  . fluconazole (DIFLUCAN) 150 MG tablet    Sig: Take one tablet by mouth as a single dose. May repeat in 3 days if symptoms persist.    Dispense:  2 tablet    Refill:  0  . metroNIDAZOLE (FLAGYL) 500 MG tablet    Sig: Take 1 tablet (500 mg total) by mouth 2 (two) times daily.    Dispense:  14 tablet    Refill:  0   Your blood pressure was noted to be elevated during your visit today. If you are currently taking medication for high blood pressure, please ensure you are taking this as directed. If you do not have a history of high blood pressure and your blood pressure remains persistently elevated, you may need to begin taking a medication at some point. You may return here within the next few days to recheck if unable to see your primary care provider or if you do not have a one.  BP (!) 153/108   Pulse 88   Temp 98.2 F (36.8 C) (Oral)   Resp 18   LMP 11/09/2020 (Approximate)   SpO2 99%   BP Readings from Last 3 Encounters:  11/23/20 (!) 153/108  04/23/20 (!) 144/93  04/21/20 (!) 138/99       Without s/s of PID. No empiric gonorrhea/chlamydia tx given.  Labs Reviewed  POCT URINALYSIS DIPSTICK, ED / UC - Abnormal; Notable for the following components:      Result Value   Hgb urine dipstick MODERATE (*)    Leukocytes,Ua SMALL (*)    All other components within normal limits  URINE CULTURE  CERVICOVAGINAL ANCILLARY ONLY     Will notify of any positive results. Instructed to refrain from sexual activity for at least seven days.  Reviewed expectations re: course of current medical issues. Questions answered. Outlined signs and symptoms indicating need for more acute intervention. Patient verbalized understanding. After Visit Summary given.   SUBJECTIVE:  Catherine Waters is a 35 y.o. female who presents with complaint of vaginal discharge and itching; reports h/o BV and yeast. Is sexually active and would like STI testing. Onset gradual. First noticed a week ago. Describes discharge as thick and opaque; without odor. No specific aggravating or alleviating factors reported. Denies: gross hematuria. Mild tingling with urination. Afebrile. No abdominal or pelvic pain. Normal PO intake wihout n/v. No genital rashes or lesions.   Patient's last menstrual period was 11/09/2020 (approximate).  Increased blood pressure noted today. Reports that she is treated for HTN.  She reports taking medications as instructed, no chest pain on exertion, no dyspnea on exertion, no swelling of ankles, no orthostatic dizziness or lightheadedness, no orthopnea or paroxysmal nocturnal dyspnea and no palpitations.   OBJECTIVE:  Vitals:   11/23/20 1941 11/23/20 1944  BP:  (!) 153/108  Pulse: 88   Resp: 18   Temp: 98.2 F (36.8 C)   TempSrc: Oral  SpO2: 99%      General appearance: alert, cooperative, appears stated age and no distress Lungs: unlabored respirations; speaks full sentences without difficulty Back: no CVA tenderness; FROM at waist CV: reg Abdomen: soft, non-tender GU: deferred Skin: warm and dry Psychological: alert and cooperative; normal mood and affect.  Results for orders placed or performed during the hospital encounter of 11/23/20  POC Urinalysis dipstick  Result Value Ref Range   Glucose, UA NEGATIVE NEGATIVE mg/dL   Bilirubin Urine NEGATIVE NEGATIVE   Ketones, ur NEGATIVE NEGATIVE mg/dL    Specific Gravity, Urine 1.015 1.005 - 1.030   Hgb urine dipstick MODERATE (A) NEGATIVE   pH 7.0 5.0 - 8.0   Protein, ur NEGATIVE NEGATIVE mg/dL   Urobilinogen, UA 0.2 0.0 - 1.0 mg/dL   Nitrite NEGATIVE NEGATIVE   Leukocytes,Ua SMALL (A) NEGATIVE    Labs Reviewed  POCT URINALYSIS DIPSTICK, ED / UC - Abnormal; Notable for the following components:      Result Value   Hgb urine dipstick MODERATE (*)    Leukocytes,Ua SMALL (*)    All other components within normal limits  URINE CULTURE  CERVICOVAGINAL ANCILLARY ONLY    Allergies  Allergen Reactions  . Minocycline     Past Medical History:  Diagnosis Date  . Asthma   . Hypertension   . Protein C deficiency ([place])    History reviewed. No pertinent family history. Social History   Socioeconomic History  . Marital status: Single    Spouse name: Not on file  . Number of children: Not on file  . Years of education: Not on file  . Highest education level: Not on file  Occupational History  . Not on file  Tobacco Use  . Smoking status: Never Smoker  . Smokeless tobacco: Never Used  Substance and Sexual Activity  . Alcohol use: Not Currently  . Drug use: Never  . Sexual activity: Yes    Birth control/protection: Surgical  Other Topics Concern  . Not on file  Social History Narrative  . Not on file   Social Determinants of Health   Financial Resource Strain: Not on file  Food Insecurity: Not on file  Transportation Needs: Not on file  Physical Activity: Not on file  Stress: Not on file  Social Connections: Not on file  Intimate Partner Violence: Not on file          Vanessa Kick, MD 11/24/20 [phone]

## 2020-11-25 LAB — URINE CULTURE

## 2021-04-05 ENCOUNTER — Ambulatory Visit
Admission: EM | Admit: 2021-04-05 | Discharge: 2021-04-05 | Disposition: A | Discharge status: Home / Self Care | Payer: BC Managed Care – PPO | Attending: Emergency Medicine | Admitting: Emergency Medicine

## 2021-04-05 ENCOUNTER — Encounter: Payer: Self-pay | Admitting: Emergency Medicine

## 2021-04-05 ENCOUNTER — Other Ambulatory Visit: Payer: Self-pay

## 2021-04-05 DIAGNOSIS — N760 Acute vaginitis: Secondary | ICD-10-CM | POA: Insufficient documentation

## 2021-04-05 DIAGNOSIS — R300 Dysuria: Secondary | ICD-10-CM | POA: Diagnosis present

## 2021-04-05 LAB — POCT URINALYSIS DIP (MANUAL ENTRY)
Bilirubin, UA: NEGATIVE
Glucose, UA: NEGATIVE mg/dL
Nitrite, UA: NEGATIVE
Protein Ur, POC: NEGATIVE mg/dL
Spec Grav, UA: 1.02 (ref 1.010–1.025)
Urobilinogen, UA: 0.2 E.U./dL
pH, UA: 5.5 (ref 5.0–8.0)

## 2021-04-05 LAB — POCT URINE PREGNANCY: Preg Test, Ur: NEGATIVE

## 2021-04-05 MED ORDER — FLUCONAZOLE 150 MG PO TABS: 150.0000 mg | ORAL_TABLET | Freq: Once | ORAL | 1 refills | Status: AC

## 2021-04-05 NOTE — ED Provider Notes (Signed)
HPI  SUBJECTIVE:  Catherine Waters is a 35 y.o. female who presents with 2 days of vaginal irritation/itching and dysuria.  Denies urgency, frequency, cloudy or odorous urine, hematuria she reports nonodorous thick, white vaginal discharge.  No vaginal rash, odor, bleeding.  She is in a monogamous relationship with a female who is asymptomatic.  STDs are not a concern today.  No nausea, vomiting, fevers, abdominal, back, pelvic pain.  No recent perfumed soaps or body washes, recent antibiotics.  No aggravating or alleviating factors.  She has not tried anything for her symptoms.  She has a past medical history of UTIs, chlamydia, trichomonas, BV, yeast.  No history of pyelonephritis, diabetes, gonorrhea, HIV, HSV, syphilis.   Past Medical History:  Diagnosis Date   Asthma    Hypertension    Protein C deficiency ([place])     Past Surgical History:  Procedure Laterality Date   NO PAST SURGERIES     TUBAL LIGATION      History reviewed. No pertinent family history.  Social History   Tobacco Use   Smoking status: Never   Smokeless tobacco: Never  Substance Use Topics   Alcohol use: Not Currently   Drug use: Never    No current facility-administered medications for this encounter.  Current Outpatient Medications:    albuterol (PROVENTIL HFA;VENTOLIN HFA) 108 (90 Base) MCG/ACT inhaler, Inhale into the lungs every 6 (six) hours as needed for wheezing or shortness of breath., Disp: , Rfl:    aspirin EC 81 MG tablet, Take 81 mg by mouth daily., Disp: , Rfl:    famotidine (PEPCID) 20 MG tablet, Take 1 tablet (20 mg total) by mouth 2 (two) times daily as needed for heartburn or indigestion., Disp: 60 tablet, Rfl: 0   fluconazole (DIFLUCAN) 150 MG tablet, Take one tablet by mouth as a single dose. May repeat in 3 days if symptoms persist., Disp: 2 tablet, Rfl: 0   loratadine (CLARITIN) 10 MG tablet, Take 10 mg by mouth daily., Disp: , Rfl:    metroNIDAZOLE (FLAGYL) 500 MG tablet, Take 1 tablet  (500 mg total) by mouth 2 (two) times daily., Disp: 14 tablet, Rfl: 0   NIFEdipine (ADALAT CC) 60 MG 24 hr tablet, Take 60 mg by mouth daily., Disp: , Rfl:    ondansetron (ZOFRAN ODT) 4 MG disintegrating tablet, Take 1 tablet (4 mg total) by mouth every 8 (eight) hours as needed for nausea or vomiting., Disp: 20 tablet, Rfl: 0   Prenatal Vit-Fe Fumarate-FA (MULTIVITAMIN-PRENATAL) 27-0.8 MG TABS tablet, Take 1 tablet by mouth daily at 12 noon., Disp: , Rfl:   Allergies  Allergen Reactions   Minocycline      ROS  As noted in HPI.   Physical Exam  BP (!) 132/97   Pulse (!) 106   Temp 99.4 F (37.4 C)   Resp 20   SpO2 98%   Constitutional: Well developed, well nourished, no acute distress Eyes:  EOMI, conjunctiva normal bilaterally HENT: Normocephalic, atraumatic,mucus membranes moist Respiratory: Normal inspiratory effort Cardiovascular: Normal rate GI: nondistended.  No suprapubic, flank tenderness  Back: No CVAT  skin: No rash, skin intact Musculoskeletal: no deformities Neurologic: Alert & oriented x 3, no focal neuro deficits Psychiatric: Speech and behavior appropriate   ED Course   Medications - No data to display  Orders Placed This Encounter  Procedures   POCT urinalysis dipstick    Standing Status:   Standing    Number of Occurrences:   1   POCT  urine pregnancy    Standing Status:   Standing    Number of Occurrences:   1    Results for orders placed or performed during the hospital encounter of 04/05/21 (from the past 24 hour(s))  POCT urinalysis dipstick     Status: Abnormal   Collection Time: 04/05/21 12:11 PM  Result Value Ref Range   Color, UA yellow yellow   Clarity, UA clear clear   Glucose, UA negative negative mg/dL   Bilirubin, UA negative negative   Ketones, POC UA trace (5) (A) negative mg/dL   Spec Grav, UA 1.020 1.010 - 1.025   Blood, UA moderate (A) negative   pH, UA 5.5 5.0 - 8.0   Protein Ur, POC negative negative mg/dL    Urobilinogen, UA 0.2 0.2 or 1.0 E.U./dL   Nitrite, UA Negative Negative   Leukocytes, UA Trace (A) Negative  POCT urine pregnancy     Status: None   Collection Time: 04/05/21 12:11 PM  Result Value Ref Range   Preg Test, Ur Negative Negative   No results found.  ED Clinical Impression  1. Vaginitis and vulvovaginitis   2. Dysuria      ED Assessment/Plan  With a thick white vaginal discharge, vaginal irritation and dysuria, I suspect yeast vaginitis rather than a UTI.  Will send urine off for culture prior to initiating antibiotics for UTI.  Swab for gonorrhea, chlamydia, trichomonas, BV and yeast sent.  Will send home with Diflucan x2.  May use miconazole OTC.  Follow-up with PMD as needed.  Discussed medical decision making, treatment plan with patient.  She agrees with plan.  No orders of the defined types were placed in this encounter.     *This clinic note was created using Dragon dictation software. Therefore, there may be occasional mistakes despite careful proofreading.  ?    Melynda Ripple, MD 04/05/21 1356

## 2021-04-05 NOTE — ED Triage Notes (Signed)
Pt here with white vaginal discharge, irritation and burning on urination x 4 days.

## 2021-04-05 NOTE — Discharge Instructions (Addendum)
We will contact you if your urine culture comes back positive for UTI or if your other labs come back positive for any other infection that requires further treatment.  Try over-the-counter Monistat/miconazole external cooling cream. Diflucan will help with the yeast infection.

## 2021-04-06 LAB — CERVICOVAGINAL ANCILLARY ONLY
Bacterial Vaginitis (gardnerella): NEGATIVE
Candida Glabrata: NEGATIVE
Candida Vaginitis: POSITIVE — AB
Chlamydia: NEGATIVE
Comment: NEGATIVE
Comment: NEGATIVE
Comment: NEGATIVE
Comment: NEGATIVE
Comment: NEGATIVE
Comment: NORMAL
Neisseria Gonorrhea: NEGATIVE
Trichomonas: NEGATIVE

## 2021-04-06 LAB — URINE CULTURE: Culture: NO GROWTH

## 2021-08-29 ENCOUNTER — Encounter: Payer: Self-pay | Admitting: Emergency Medicine

## 2021-08-29 ENCOUNTER — Other Ambulatory Visit: Payer: Self-pay

## 2021-08-29 ENCOUNTER — Ambulatory Visit
Admission: EM | Admit: 2021-08-29 | Discharge: 2021-08-29 | Disposition: A | Discharge status: Home / Self Care | Payer: BC Managed Care – PPO | Attending: Student | Admitting: Student

## 2021-08-29 DIAGNOSIS — Z9851 Tubal ligation status: Secondary | ICD-10-CM | POA: Diagnosis present

## 2021-08-29 DIAGNOSIS — Z975 Presence of (intrauterine) contraceptive device: Secondary | ICD-10-CM | POA: Insufficient documentation

## 2021-08-29 DIAGNOSIS — N760 Acute vaginitis: Secondary | ICD-10-CM | POA: Diagnosis present

## 2021-08-29 LAB — POCT URINE PREGNANCY: Preg Test, Ur: NEGATIVE

## 2021-08-29 LAB — POCT URINALYSIS DIP (MANUAL ENTRY)
Bilirubin, UA: NEGATIVE
Glucose, UA: NEGATIVE mg/dL
Nitrite, UA: NEGATIVE
Protein Ur, POC: 100 mg/dL — AB
Spec Grav, UA: 1.02 (ref 1.010–1.025)
Urobilinogen, UA: 0.2 E.U./dL
pH, UA: 6 (ref 5.0–8.0)

## 2021-08-29 MED ORDER — METRONIDAZOLE 500 MG PO TABS: 500.0000 mg | ORAL_TABLET | Freq: Two times a day (BID) | ORAL | 0 refills | Status: DC

## 2021-08-29 NOTE — ED Provider Notes (Signed)
[name]    CSN: [id number] Arrival date & time: 08/29/21  1332      History   Chief Complaint Chief Complaint  Patient presents with   Vaginal Itching   Vaginal Bleeding   Urinary Frequency    HPI Catherine Waters is a 36 y.o. female presenting with urinary symptoms and vaginal bleeding x1 week. Medical history tubal ligation. Describes urinary frequency, vaginal itching, vaginal bleeding. States external vaginal itching.  Vaginal spotting, using about 1 pad daily. Her period was 14 days ago 1/16 and she was not due to restart this yet. No recent abx. Urinary frequency, but denies dysuria, hematuria, frequency, urgency, abd pain, flank pain, fevers/chills. Denies vaginal discharge though hard to tell given spotting. Denies STI risk. History tubal ligation and IUD contraception. States frequent vaginitis following IUD, which was placed 2020.   HPI  Past Medical History:  Diagnosis Date   Asthma    Hypertension    Protein C deficiency [facility])     Patient Active Problem List   Diagnosis Date Noted   Pelvic pressure in pregnancy, antepartum, third trimester 09/27/2018    Past Surgical History:  Procedure Laterality Date   NO PAST SURGERIES     TUBAL LIGATION      OB History     Gravida  2   Para  1   Term      Preterm  1   AB      Living  1      SAB      IAB      Ectopic      Multiple      Live Births  1            Home Medications    Prior to Admission medications   Medication Sig Start Date End Date Taking? Authorizing Provider  levonorgestrel (MIRENA) 20 MCG/DAY IUD 1 each by Intrauterine route once.   Yes [provider]  melatonin 3 MG TABS tablet Take by mouth. 03/12/18  Yes [provider]  metroNIDAZOLE (FLAGYL) 500 MG tablet Take 1 tablet (500 mg total) by mouth 2 (two) times daily. 08/29/21  Yes Hazel Sams, PA-C  potassium chloride SA (KLOR-CON M) 20 MEQ tablet  09/03/20  Yes [provider]   sertraline (ZOLOFT) 50 MG tablet Take 1 tablet by mouth daily. 03/15/20  Yes [provider]  albuterol (PROVENTIL HFA;VENTOLIN HFA) 108 (90 Base) MCG/ACT inhaler Inhale into the lungs every 6 (six) hours as needed for wheezing or shortness of breath.    [provider]  aspirin EC 81 MG tablet Take 81 mg by mouth daily.    [provider]  famotidine (PEPCID) 20 MG tablet Take by mouth.    [provider]  ibuprofen (ADVIL) 600 MG tablet Take 600 mg by mouth every 6 (six) hours as needed. 06/19/21   [provider]  loratadine (CLARITIN) 10 MG tablet Take 10 mg by mouth daily.    [provider]  NIFEdipine (ADALAT CC) 60 MG 24 hr tablet Take 60 mg by mouth daily.    [provider]  ondansetron (ZOFRAN ODT) 4 MG disintegrating tablet Take 1 tablet (4 mg total) by mouth every 8 (eight) hours as needed for nausea or vomiting. 09/27/18   Paulette Blanch, MD  OZEMPIC, 0.25 OR 0.5 MG/DOSE, 2 MG/1.5ML SOPN Inject into the skin. 08/03/21   [provider]  Prenatal Vit-Fe Fumarate-FA (MULTIVITAMIN-PRENATAL) 27-0.8 MG TABS tablet Take  1 tablet by mouth daily at 12 noon.    [provider]  topiramate (TOPAMAX) 50 MG tablet Take 50 mg by mouth daily. 08/03/21   [provider]    Family History No family history on file.  Social History Social History   Tobacco Use   Smoking status: Never   Smokeless tobacco: Never  Vaping Use   Vaping Use: Never used  Substance Use Topics   Alcohol use: Not Currently   Drug use: Never     Allergies   Minocycline   Review of Systems Review of Systems  Constitutional:  Negative for chills and fever.  HENT:  Negative for sore throat.   Eyes:  Negative for pain and redness.  Respiratory:  Negative for shortness of breath.   Cardiovascular:  Negative for chest pain.  Gastrointestinal:  Negative for abdominal pain, diarrhea, nausea and vomiting.  Genitourinary:  Positive  for vaginal bleeding. Negative for decreased urine volume, difficulty urinating, dysuria, flank pain, frequency, genital sores, hematuria and urgency.  Musculoskeletal:  Negative for back pain.  Skin:  Negative for rash.  All other systems reviewed and are negative.   Physical Exam Triage Vital Signs ED Triage Vitals  Enc Vitals Group     BP      Pulse      Resp      Temp      Temp src      SpO2      Weight      Height      Head Circumference      Peak Flow      Pain Score      Pain Loc      Pain Edu?      Excl. in [place]?    No data found.  Updated Vital Signs BP 114/79 (BP Location: Right Arm)    Pulse (!) 110    Temp 99.5 F (37.5 C) (Oral)    Resp 18    LMP 08/14/2021    SpO2 96%   Visual Acuity Right Eye Distance:   Left Eye Distance:   Bilateral Distance:    Right Eye Near:   Left Eye Near:    Bilateral Near:     Physical Exam Vitals reviewed.  Constitutional:      General: She is not in acute distress.    Appearance: Normal appearance. She is not ill-appearing.  HENT:     Head: Normocephalic and atraumatic.     Mouth/Throat:     Mouth: Mucous membranes are moist.     Comments: Moist mucous membranes Eyes:     Extraocular Movements: Extraocular movements intact.     Pupils: Pupils are equal, round, and reactive to light.  Cardiovascular:     Rate and Rhythm: Regular rhythm. Tachycardia present.     Heart sounds: Normal heart sounds.  Pulmonary:     Effort: Pulmonary effort is normal.     Breath sounds: Normal breath sounds. No wheezing, rhonchi or rales.  Abdominal:     General: Bowel sounds are normal. There is no distension.     Palpations: Abdomen is soft. There is no mass.     Tenderness: There is no abdominal tenderness. There is no right CVA tenderness, left CVA tenderness, guarding or rebound.  Genitourinary:    Comments: deferred Skin:    General: Skin is warm.     Capillary Refill: Capillary refill takes less than 2 seconds.     Comments:  Good skin turgor  Neurological:     General: No focal deficit present.     Mental Status: She is alert and oriented to person, place, and time.  Psychiatric:        Mood and Affect: Mood normal.        Behavior: Behavior normal.     UC Treatments / Results  Labs (all labs ordered are listed, but only abnormal results are displayed) Labs Reviewed  POCT URINALYSIS DIP (MANUAL ENTRY) - Abnormal; Notable for the following components:      Result Value   Clarity, UA cloudy (*)    Ketones, POC UA trace (5) (*)    Blood, UA trace-intact (*)    Protein Ur, POC =100 (*)    Leukocytes, UA Trace (*)    All other components within normal limits  URINE CULTURE  POCT URINE PREGNANCY  CERVICOVAGINAL ANCILLARY ONLY    EKG   Radiology No results found.  Procedures Procedures (including critical care time)  Medications Ordered in UC Medications - No data to display  Initial Impression / Assessment and Plan / UC Course  I have reviewed the triage vital signs and the nursing notes.  Pertinent labs & imaging results that were available during my care of the patient were reviewed by me and considered in my medical decision making (see chart for details).     This patient is a very pleasant 35 y.o. year old female presenting with vaginitis. Borderline tachy but afebrile, no reproducible abd pain or CVAT.  History tubal ligation. U-preg negative today. UA with trace blood and trace leuk, culture sent.  Denies STI risk. Has already taken 1 pill of diflucan without relief. Will send self-swab for G/C, trich, yeast, BV testing. Declines HIV, RPR. Safe sex precautions.   Suspect vaginitis/ BV. Low concern for UTI or pyelo based on presentation and exam (no abd pain or CVAT; presence of external vaginal irritation). Will send culture as a precautionary measure. Will manage with flagyl for now.   ED return precautions discussed. Patient verbalizes understanding and agreement.   Coding Level  4 for acute illness with systemic symptoms, and prescription drug management   Final Clinical Impressions(s) / UC Diagnoses   Final diagnoses:  Vaginitis and vulvovaginitis  IUD contraception  History of bilateral tubal ligation     Discharge Instructions      -For bacterial vaginosis, start the antibiotic-Flagyl (metronidazole), 2 pills daily for 7 days.  You can take this with food if you have a sensitive stomach.  Avoid alcohol while taking this medication and for 2 days after as this will cause severe nausea and vomiting. -We have sent testing for sexually transmitted infections. We will notify you of any positive results once they are received. If required, we will prescribe any medications you might need. Please refrain from all sexual activity until treatment is complete.  -Seek additional medical attention if you develop fevers/chills, new/worsening abdominal pain, new/worsening vaginal discomfort/discharge, etc.       ED Prescriptions     Medication Sig Dispense Auth. Provider   metroNIDAZOLE (FLAGYL) 500 MG tablet Take 1 tablet (500 mg total) by mouth 2 (two) times daily. 14 tablet Hazel Sams, PA-C      PDMP not reviewed this encounter.   Hazel Sams, PA-C 08/29/21 1417

## 2021-08-29 NOTE — Discharge Instructions (Addendum)
-  For bacterial vaginosis, start the antibiotic-Flagyl (metronidazole), 2 pills daily for 7 days.  You can take this with food if you have a sensitive stomach.  Avoid alcohol while taking this medication and for 2 days after as this will cause severe nausea and vomiting. -We have sent testing for sexually transmitted infections. We will notify you of any positive results once they are received. If required, we will prescribe any medications you might need. Please refrain from all sexual activity until treatment is complete.  -Seek additional medical attention if you develop fevers/chills, new/worsening abdominal pain, new/worsening vaginal discomfort/discharge, etc.   

## 2021-08-29 NOTE — ED Triage Notes (Signed)
Pt c/o urinary frequency, vaginal itching and vaginal bleeding x 1 week.

## 2021-08-30 LAB — CERVICOVAGINAL ANCILLARY ONLY
Bacterial Vaginitis (gardnerella): POSITIVE — AB
Candida Glabrata: NEGATIVE
Candida Vaginitis: NEGATIVE
Chlamydia: NEGATIVE
Comment: NEGATIVE
Comment: NEGATIVE
Comment: NEGATIVE
Comment: NEGATIVE
Comment: NEGATIVE
Comment: NORMAL
Neisseria Gonorrhea: NEGATIVE
Trichomonas: NEGATIVE

## 2021-08-31 LAB — URINE CULTURE

## 2021-12-05 ENCOUNTER — Ambulatory Visit
Admission: EM | Admit: 2021-12-05 | Discharge: 2021-12-05 | Disposition: A | Discharge status: Home / Self Care | Payer: BC Managed Care – PPO | Attending: Student | Admitting: Student

## 2021-12-05 DIAGNOSIS — N61 Mastitis without abscess: Secondary | ICD-10-CM | POA: Diagnosis not present

## 2021-12-05 DIAGNOSIS — N760 Acute vaginitis: Secondary | ICD-10-CM

## 2021-12-05 LAB — POCT URINALYSIS DIP (MANUAL ENTRY)
Bilirubin, UA: NEGATIVE
Glucose, UA: NEGATIVE mg/dL
Ketones, POC UA: NEGATIVE mg/dL
Leukocytes, UA: NEGATIVE
Nitrite, UA: NEGATIVE
Spec Grav, UA: >=1.03 — AB (ref 1.010–1.025)
Urobilinogen, UA: 0.2 E.U./dL
pH, UA: 5.5 (ref 5.0–8.0)

## 2021-12-05 LAB — POCT URINE PREGNANCY: Preg Test, Ur: NEGATIVE

## 2021-12-05 MED ORDER — FLUCONAZOLE 150 MG PO TABS: 150.0000 mg | ORAL_TABLET | Freq: Every day | ORAL | 0 refills | Status: DC

## 2021-12-05 MED ORDER — METRONIDAZOLE 500 MG PO TABS: 500.0000 mg | ORAL_TABLET | Freq: Two times a day (BID) | ORAL | 0 refills | Status: DC

## 2021-12-05 MED ORDER — CEPHALEXIN 500 MG PO CAPS: 500.0000 mg | ORAL_CAPSULE | Freq: Four times a day (QID) | ORAL | 0 refills | Status: DC

## 2021-12-05 NOTE — Discharge Instructions (Addendum)
-  Start the antibiotic: Keflex, 4x daily x5 days. You can take this with food if you have a sensitive stomach. This will treat your skin infection, and also treats for UTI, though your urine looks normal today.  ?-For bacterial vaginosis, start the antibiotic-Flagyl (metronidazole), 2 pills daily for 7 days.  You can take this with food if you have a sensitive stomach.  Avoid alcohol while taking this medication and for 2 days after as this will cause severe nausea and vomiting. ?-We'll call with any other positive lab results. ?

## 2021-12-05 NOTE — ED Triage Notes (Signed)
Patient presents to Urgent Care with complaints of vaginal discharge x 1 week. Also concerned with bilateral nipple drainage/pain. She states she cleansed nipples with peroxide. Concerned they may be infected.  ?

## 2021-12-05 NOTE — ED Provider Notes (Signed)
?[place] ? ? ? ?CSN: [id number] ?Arrival date & time: 12/05/21  1828 ? ? ?  ? ?History   ?Chief Complaint ?Chief Complaint  ?Patient presents with  ? Vaginal Discharge  ? ? ?HPI ?Catherine Waters is a 36 y.o. female presenting with vaginitis for about 1 week.  History BV, tubal ligation.  Describes about 1 week of off-white vaginal discharge with mild odor.  States symptoms began after intercourse with previous female partner.  She states she is feeling well otherwise, denies abd pain, flank pain, fever/chills, vaginal irritation, vaginal rash.  She does incidentally note intermittent nipple drainage from her nipple piercings for about 1 year.  States they were pierced about 1 year ago, she is unsure of what metal is in place.  Has been cleansing the nipples with hydrogen peroxide.  States there is no discharge from the actual nipples, it is just from the piercings.  Denies fever/chills. ? ?HPI ? ?Past Medical History:  ?Diagnosis Date  ? Asthma   ? Hypertension   ? Protein C deficiency ([place])   ? ? ?Patient Active Problem List  ? Diagnosis Date Noted  ? Pelvic pressure in pregnancy, antepartum, third trimester 09/27/2018  ? ? ?Past Surgical History:  ?Procedure Laterality Date  ? NO PAST SURGERIES    ? TUBAL LIGATION    ? ? ?OB History   ? ? Gravida  ?2  ? Para  ?1  ? Term  ?   ? Preterm  ?1  ? AB  ?   ? Living  ?1  ?  ? ? SAB  ?   ? IAB  ?   ? Ectopic  ?   ? Multiple  ?   ? Live Births  ?1  ?   ?  ?  ? ? ? ?Home Medications   ? ?Prior to Admission medications   ?Medication Sig Start Date End Date Taking? Authorizing Provider  ?cephALEXin (KEFLEX) 500 MG capsule Take 1 capsule (500 mg total) by mouth 4 (four) times daily. 12/05/21  Yes Hazel Sams, PA-C  ?fluconazole (DIFLUCAN) 150 MG tablet Take 1 tablet (150 mg total) by mouth daily. -For your yeast infection, start the Diflucan (fluconazole)- Take one pill today (day 1). If you're still having symptoms in 3 days, take the second pill. 12/05/21  Yes  Hazel Sams, PA-C  ?metroNIDAZOLE (FLAGYL) 500 MG tablet Take 1 tablet (500 mg total) by mouth 2 (two) times daily. Avoid alcohol while taking this medication and for 2 days after 12/05/21  Yes Hazel Sams, PA-C  ?albuterol (PROVENTIL HFA;VENTOLIN HFA) 108 (90 Base) MCG/ACT inhaler Inhale into the lungs every 6 (six) hours as needed for wheezing or shortness of breath.    [provider]  ?aspirin EC 81 MG tablet Take 81 mg by mouth daily.    [provider]  ?famotidine (PEPCID) 20 MG tablet Take by mouth.    [provider]  ?ibuprofen (ADVIL) 600 MG tablet Take 600 mg by mouth every 6 (six) hours as needed. 06/19/21   [provider]  ?levonorgestrel (MIRENA) 20 MCG/DAY IUD 1 each by Intrauterine route once.    [provider]  ?loratadine (CLARITIN) 10 MG tablet Take 10 mg by mouth daily.    [provider]  ?melatonin 3 MG TABS tablet Take by mouth. 03/12/18   [provider]  ?NIFEdipine (ADALAT CC) 60 MG 24 hr tablet Take 60 mg by mouth daily.    [provider]  ?ondansetron (ZOFRAN ODT) 4 MG disintegrating tablet Take 1 tablet (4 mg total) by mouth every 8 (eight) hours as needed for nausea or vomiting. 09/27/18   Paulette Blanch, MD  ?OZEMPIC, 0.25 OR 0.5 MG/DOSE, 2 MG/1.5ML SOPN Inject into the skin. 08/03/21   [provider]  ?potassium chloride SA (KLOR-CON M) 20 MEQ tablet  09/03/20   [provider]  ?Prenatal Vit-Fe Fumarate-FA (MULTIVITAMIN-PRENATAL) 27-0.8 MG TABS tablet Take 1 tablet by mouth daily at 12 noon.    [provider]  ?sertraline (ZOLOFT) 50 MG tablet Take 1 tablet by mouth daily. 03/15/20   [provider]  ?topiramate (TOPAMAX) 50 MG tablet Take 50 mg by mouth daily. 08/03/21   [provider]  ? ? ?Family History ?History reviewed. No pertinent family history. ? ?Social History ?Social History  ? ?Tobacco Use  ? Smoking status: Never  ? Smokeless tobacco: Never  ?Vaping  Use  ? Vaping Use: Never used  ?Substance Use Topics  ? Alcohol use: Not Currently  ? Drug use: Never  ? ? ? ?Allergies   ?Minocycline ? ? ?Review of Systems ?Review of Systems  ?Constitutional:  Negative for chills and fever.  ?HENT:  Negative for sore throat.   ?Eyes:  Negative for pain and redness.  ?Respiratory:  Negative for shortness of breath.   ?Cardiovascular:  Negative for chest pain.  ?Gastrointestinal:  Negative for abdominal pain, diarrhea, nausea and vomiting.  ?Genitourinary:  Positive for vaginal discharge. Negative for decreased urine volume, difficulty urinating, dysuria, flank pain, frequency, genital sores, hematuria and urgency.  ?Musculoskeletal:  Negative for back pain.  ?Skin:  Negative for rash.  ?All other systems reviewed and are negative. ? ? ?Physical Exam ?Triage Vital Signs ?ED Triage Vitals  ?Enc Vitals Group  ?   BP   ?   Pulse   ?   Resp   ?   Temp   ?   Temp src   ?   SpO2   ?   Weight   ?   Height   ?   Head Circumference   ?   Peak Flow   ?   Pain Score   ?   Pain Loc   ?   Pain Edu?   ?   Excl. in [place]?   ? ?No data found. ? ?Updated Vital Signs ?BP (!) 148/103 (BP Location: Left Arm)   Pulse 95   Resp 16   SpO2 97%  ? ?Visual Acuity ?Right Eye Distance:   ?Left Eye Distance:   ?Bilateral Distance:   ? ?Right Eye Near:   ?Left Eye Near:    ?Bilateral Near:    ? ?Physical Exam ?Vitals reviewed.  ?Constitutional:   ?   General: She is not in acute distress. ?   Appearance: Normal appearance. She is not ill-appearing.  ?HENT:  ?   Head: Normocephalic and atraumatic.  ?   Mouth/Throat:  ?   Mouth: Mucous membranes are moist.  ?   Comments: Moist mucous membranes ?Eyes:  ?   Extraocular Movements: Extraocular movements intact.  ?   Pupils: Pupils are equal, round, and reactive to light.  ?Cardiovascular:  ?   Rate and Rhythm: Normal rate and regular rhythm.  ?   Heart sounds: Normal heart sounds.  ?Pulmonary:  ?   Effort: Pulmonary effort is normal.  ?   Breath sounds: Normal breath  sounds. No wheezing, rhonchi or rales.  ?Chest:  ?  Comments: Nipple piercings in place. No nipple swelling or drainage on exam. No surrounding breast tenderness, erythema, mass. No spontaneous discharge elicited.  ?Abdominal:  ?   General: Bowel sounds are normal. There is no distension.  ?   Palpations: Abdomen is soft. There is no mass.  ?   Tenderness: There is no abdominal tenderness. There is no right CVA tenderness, left CVA tenderness, guarding or rebound.  ?Skin: ?   General: Skin is warm.  ?   Capillary Refill: Capillary refill takes less than 2 seconds.  ?   Comments: Good skin turgor  ?Neurological:  ?   General: No focal deficit present.  ?   Mental Status: She is alert and oriented to person, place, and time.  ?Psychiatric:     ?   Mood and Affect: Mood normal.     ?   Behavior: Behavior normal.  ? ? ? ?UC Treatments / Results  ?Labs ?(all labs ordered are listed, but only abnormal results are displayed) ?Labs Reviewed  ?POCT URINALYSIS DIP (MANUAL ENTRY) - Abnormal; Notable for the following components:  ?    Result Value  ? Clarity, UA cloudy (*)   ? Spec Grav, UA >=1.030 (*)   ? Blood, UA small (*)   ? Protein Ur, POC trace (*)   ? All other components within normal limits  ?POCT URINE PREGNANCY  ?CERVICOVAGINAL ANCILLARY ONLY  ? ? ?EKG ? ? ?Radiology ?No results found. ? ?Procedures ?Procedures (including critical care time) ? ?Medications Ordered in UC ?Medications - No data to display ? ?Initial Impression / Assessment and Plan / UC Course  ?I have reviewed the triage vital signs and the nursing notes. ? ?Pertinent labs & imaging results that were available during my care of the patient were reviewed by me and considered in my medical decision making (see chart for details). ? ?  ? ?This patient is a very pleasant 37 y.o. year old female presenting with vaginitis and nipple piercing infection. Afebrile, nontachycardic, no reproducible abd pain or CVAT. ? ?UA with small blood, otherwise wnl. Did  not send a culture . ?U-preg negative . ?Will send self-swab for G/C, trich, yeast, BV testing. Declines HIV, RPR. Safe sex precautions.  ? ?Will manage vaginitis with flagyl, and diflucan sent for yeast proph

## 2021-12-07 LAB — CERVICOVAGINAL ANCILLARY ONLY
Bacterial Vaginitis (gardnerella): POSITIVE — AB
Candida Glabrata: NEGATIVE
Candida Vaginitis: POSITIVE — AB
Chlamydia: NEGATIVE
Comment: NEGATIVE
Comment: NEGATIVE
Comment: NEGATIVE
Comment: NEGATIVE
Comment: NEGATIVE
Comment: NORMAL
Neisseria Gonorrhea: NEGATIVE
Trichomonas: NEGATIVE

## 2021-12-10 ENCOUNTER — Encounter: Payer: Self-pay | Admitting: Emergency Medicine

## 2021-12-10 ENCOUNTER — Ambulatory Visit
Admission: EM | Admit: 2021-12-10 | Discharge: 2021-12-10 | Disposition: A | Discharge status: Home / Self Care | Payer: BC Managed Care – PPO | Attending: Emergency Medicine | Admitting: Emergency Medicine

## 2021-12-10 ENCOUNTER — Other Ambulatory Visit: Payer: Self-pay

## 2021-12-10 DIAGNOSIS — N9489 Other specified conditions associated with female genital organs and menstrual cycle: Secondary | ICD-10-CM | POA: Diagnosis not present

## 2021-12-10 NOTE — ED Triage Notes (Signed)
Patient took medication for UTI and BV on Thursday and was seen at [facility] on 12/05/21.  Patient reports swelling in her labia yesterday.  Patient also reports tenderness and pain.   ?

## 2021-12-10 NOTE — Discharge Instructions (Signed)
You were seen for labial pain and are being treated for the same.  ? ?-Continue your antibiotics as directed from your previous visit. ?-Keep your feminine hygiene very simple: No scented soaps or lotions or creams. ?-Follow-up with your primary if symptoms do not improve or the emergency department if symptoms worsen. ? ?Take care, ?Dr. Marland Kitchen, NP-c ? ?

## 2021-12-10 NOTE — ED Provider Notes (Signed)
?[facility] - [place], [place] ? ? ?Name: Catherine Waters ?DOB: [birth year] ?MRN: [id number] ?CSN: [id number] ?PCP: [facility] ? ?Arrival date and time:  ?12/10/21 1211 ? ?Chief Complaint:  ?vaginal swelling ? ? ?NOTE: Prior to seeing the patient today, I have reviewed the triage nursing documentation and vital signs. Clinical staff has updated patient's PMH/PSHx, current medication list, and drug allergies/intolerances to ensure comprehensive history available to assist in medical decision making.  ? ?History:   ?HPI: Catherine Waters is a 36 y.o. female who presents today with complaints of vaginal pain.  Patient states the symptoms started less than 24 hours ago.  Of note, she is currently on medication for positive BV and yeast.  She is on cephalexin for possible skin infection and UTI.  She noticed increased soreness to her labia overnight.  She denies any cuts or bites to the area.  No recent sexual partners.  She does note that she soaked in an Epsom salt bath approximately 48 hours ago. ? ? ?Past Medical History:  ?Diagnosis Date  ? Asthma   ? Hypertension   ? 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 ? ?Past Surgical History:  ?Procedure Laterality Date  ? NO PAST SURGERIES    ? TUBAL LIGATION    ? ? ?History reviewed. No pertinent family history. ? ?Social History  ? ?Tobacco Use  ? Smoking status: Never  ? Smokeless tobacco: Never  ?Vaping Use  ? Vaping Use: Never used  ?Substance Use Topics  ? Alcohol use: Not Currently  ? Drug use: Never  ? ? ?Patient Active Problem List  ? Diagnosis Date Noted  ? Pelvic pressure in pregnancy, antepartum, third trimester 09/27/2018  ? ? ?Home Medications:   ? ?Current Meds  ?Medication Sig  ? cephALEXin (KEFLEX) 500 MG capsule Take 1 capsule (500 mg total) by mouth 4 (four) times daily.  ? ? ?Allergies:   ?Minocycline ? ?Review of Systems (ROS): ?Review of Systems  ?Genitourinary:  Positive for dysuria, genital sores, vaginal discharge and vaginal pain.  ?All  other systems reviewed and are negative.  ? ?Vital Signs: ?Today's Vitals  ? 12/10/21 1231 12/10/21 1234 12/10/21 1307  ?BP:  (!) 125/94   ?Pulse:  (!) 105   ?Resp:  15   ?Temp:  99.2 ?F (37.3 ?C)   ?TempSrc:  Oral   ?SpO2:  98%   ?Weight: 227 lb 1.2 oz (103 kg)    ?Height: '5\' 3"'$  (1.6 m)    ?PainSc: 8   8   ? ? ?Physical Exam: ?Physical Exam ?Vitals and nursing note reviewed.  ?Constitutional:   ?   Appearance: Normal appearance.  ?Cardiovascular:  ?   Rate and Rhythm: Normal rate and regular rhythm.  ?   Pulses: Normal pulses.  ?   Heart sounds: Normal heart sounds.  ?Pulmonary:  ?   Breath sounds: Normal breath sounds.  ?Genitourinary: ?   Exam position: Supine.  ?   Pubic Area: No rash.   ?   Labia:     ?   Right: Tenderness and lesion present. No rash.   ?   Comments: Lesion noted to right labia.  Tenderness noted per patient.  No signs of active infection to the area.  Moderate amount of drainage noticed. ?Skin: ?   General: Skin is warm and dry.  ?Neurological:  ?   Mental Status: She is alert.  ?Psychiatric:     ?   Mood and Affect:  Mood normal.     ?   Behavior: Behavior normal.     ?   Thought Content: Thought content normal.  ? ? ? ?Urgent Care Treatments / Results:  ? ?LABS: ?PLEASE NOTE: all labs that were ordered this encounter are listed, however only abnormal results are displayed. ?Labs Reviewed - No data to display ? ?EKG: ?-None ? ?RADIOLOGY: ?No results found. ? ?PROCEDURES: ?Procedures ? ?MEDICATIONS RECEIVED THIS VISIT: ?Medications - No data to display ? ?PERTINENT CLINICAL COURSE NOTES/UPDATES: ?  ?Initial Impression / Assessment and Plan / Urgent Care Course:  ?Pertinent labs & imaging results that were available during my care of the patient were personally reviewed by me and considered in my medical decision making (see lab/imaging section of note for values and interpretations). ? ?Catherine Waters is a 36 y.o. female who presents to [facility] today with complaints of vaginal pain,  diagnosed with labial lesion, and treated as such with the directions below. NP and patient reviewed discharge instructions below during visit.  ? ?Patient is well appearing overall in clinic today. She does not appear to be in any acute distress. Presenting symptoms (see HPI) and exam as documented above.  ? ?I have reviewed the follow up and strict return precautions for any new or worsening symptoms. Patient is aware of symptoms that would be deemed urgent/emergent, and would thus require further evaluation either here or in the emergency department. At the time of discharge, she verbalized understanding and consent with the discharge plan as it was reviewed with her. All questions were fielded by provider and/or clinic staff prior to patient discharge.   ? ?Final Clinical Impressions / Urgent Care Diagnoses:  ? ?Final diagnoses:  ?Labial swelling  ? ? ?New Prescriptions:  ?[place] Controlled Substance Registry consulted? Not Applicable ? ?No orders of the defined types were placed in this encounter. ? ? ? ? ?Discharge Instructions   ? ?  ?You were seen for labial pain and are being treated for the same.  ? ?-Continue your antibiotics as directed from your previous visit. ?-Keep your feminine hygiene very simple: No scented soaps or lotions or creams. ?-Follow-up with your primary if symptoms do not improve or the emergency department if symptoms worsen. ? ?Take care, ?Dr. Marland Kitchen, NP-c ? ? ? ? ?Recommended Follow up Care:  ?Patient encouraged to follow up with the following provider within the specified time frame, or sooner as dictated by the severity of her symptoms. As always, she was instructed that for any urgent/emergent care needs, she should seek care either here or in the emergency department for more immediate evaluation. ? ? ?Gertie Baron, DNP, NP-c ?  ?Gertie Baron, NP ?12/10/21 1413 ? ?

## 2022-02-01 ENCOUNTER — Ambulatory Visit
Admission: EM | Admit: 2022-02-01 | Discharge: 2022-02-01 | Disposition: A | Discharge status: Home / Self Care | Payer: BC Managed Care – PPO

## 2022-02-01 ENCOUNTER — Encounter: Payer: Self-pay | Admitting: Emergency Medicine

## 2022-02-01 DIAGNOSIS — J4521 Mild intermittent asthma with (acute) exacerbation: Secondary | ICD-10-CM | POA: Diagnosis not present

## 2022-02-01 MED ORDER — ALBUTEROL SULFATE HFA 108 (90 BASE) MCG/ACT IN AERS: 1.0000 | INHALATION_SPRAY | Freq: Four times a day (QID) | RESPIRATORY_TRACT | 0 refills | Status: AC | PRN

## 2022-02-01 MED ORDER — PREDNISONE 10 MG PO TABS: 40.0000 mg | ORAL_TABLET | Freq: Every day | ORAL | 0 refills | Status: AC

## 2022-02-01 MED ORDER — BENZONATATE 100 MG PO CAPS: 100.0000 mg | ORAL_CAPSULE | Freq: Three times a day (TID) | ORAL | 0 refills | Status: AC | PRN

## 2022-02-01 NOTE — Discharge Instructions (Signed)
Use the inhaler and take the prednisone as directed.  Follow up with your primary care provider.

## 2022-02-01 NOTE — ED Provider Notes (Signed)
Catherine Waters    CSN: [id number] Arrival date & time: 02/01/22  0808      History   Chief Complaint Chief Complaint  Patient presents with   Cough   Dizziness   Shortness of Breath    HPI Catherine Waters is a 36 y.o. female.  Patient presents with cough, wheezing, shortness of breath x1 day.  Her symptoms are worse at night.  She denies fever, rash, vomiting, diarrhea, or other symptoms.  No treatments at home. Patient has not required an albuterol inhaler in the past year.  Her medical history includes asthma and hypertension.  LMP 2 weeks ago; She denies current pregnancy or breastfeeding.     The history is provided by the patient and medical records.    Past Medical History:  Diagnosis Date   Asthma    Hypertension    Protein C deficiency [facility])     Patient Active Problem List   Diagnosis Date Noted   Pelvic pressure in pregnancy, antepartum, third trimester 09/27/2018    Past Surgical History:  Procedure Laterality Date   NO PAST SURGERIES     TUBAL LIGATION      OB History     Gravida  2   Para  1   Term      Preterm  1   AB      Living  1      SAB      IAB      Ectopic      Multiple      Live Births  1            Home Medications    Prior to Admission medications   Medication Sig Start Date End Date Taking? Authorizing Provider  albuterol (VENTOLIN HFA) 108 (90 Base) MCG/ACT inhaler Inhale 1-2 puffs into the lungs every 6 (six) hours as needed for wheezing or shortness of breath. 02/01/22  Yes Sharion Balloon, NP  benzonatate (TESSALON) 100 MG capsule Take 1 capsule (100 mg total) by mouth 3 (three) times daily as needed for cough. 02/01/22  Yes Sharion Balloon, NP  levonorgestrel (MIRENA) 20 MCG/DAY IUD 1 each by Intrauterine route once.   Yes [provider]  loratadine (CLARITIN) 10 MG tablet Take 10 mg by mouth daily.   Yes [provider]  NIFEdipine (ADALAT CC) 60 MG 24 hr tablet Take 60 mg by mouth daily.    Yes [provider]  ondansetron (ZOFRAN ODT) 4 MG disintegrating tablet Take 1 tablet (4 mg total) by mouth every 8 (eight) hours as needed for nausea or vomiting. 09/27/18  Yes Paulette Blanch, MD  OZEMPIC, 0.25 OR 0.5 MG/DOSE, 2 MG/1.5ML SOPN Inject into the skin. 08/03/21  Yes [provider]  potassium chloride SA (KLOR-CON M) 20 MEQ tablet  09/03/20  Yes [provider]  predniSONE (DELTASONE) 10 MG tablet Take 4 tablets (40 mg total) by mouth daily for 5 days. 02/01/22 02/06/22 Yes Sharion Balloon, NP  Prenatal Vit-Fe Fumarate-FA (MULTIVITAMIN-PRENATAL) 27-0.8 MG TABS tablet Take 1 tablet by mouth daily at 12 noon.   Yes [provider]  sertraline (ZOLOFT) 50 MG tablet Take 1 tablet by mouth daily. 03/15/20  Yes [provider]  topiramate (TOPAMAX) 50 MG tablet Take 50 mg by mouth daily. 08/03/21  Yes [provider]  aspirin EC 81 MG tablet Take 81 mg by mouth daily.    [provider]  cephALEXin (KEFLEX) 500 MG capsule  Take 1 capsule (500 mg total) by mouth 4 (four) times daily. 12/05/21   Hazel Sams, PA-C  famotidine (PEPCID) 20 MG tablet Take by mouth.    [provider]  fluconazole (DIFLUCAN) 150 MG tablet Take 1 tablet (150 mg total) by mouth daily. -For your yeast infection, start the Diflucan (fluconazole)- Take one pill today (day 1). If you're still having symptoms in 3 days, take the second pill. 12/05/21   Hazel Sams, PA-C  ibuprofen (ADVIL) 600 MG tablet Take 600 mg by mouth every 6 (six) hours as needed. 06/19/21   [provider]  melatonin 3 MG TABS tablet Take by mouth. 03/12/18   [provider]  metroNIDAZOLE (FLAGYL) 500 MG tablet Take 1 tablet (500 mg total) by mouth 2 (two) times daily. Avoid alcohol while taking this medication and for 2 days after 12/05/21   Hazel Sams, PA-C  SUMAtriptan (IMITREX) 50 MG tablet SMARTSIG:1 Tablet(s) By Mouth 01/29/22   [provider]    Family  History No family history on file.  Social History Social History   Tobacco Use   Smoking status: Never   Smokeless tobacco: Never  Vaping Use   Vaping Use: Never used  Substance Use Topics   Alcohol use: Not Currently   Drug use: Never     Allergies   Minocycline   Review of Systems Review of Systems  Constitutional:  Negative for chills and fever.  HENT:  Negative for ear pain and sore throat.   Respiratory:  Positive for cough, shortness of breath and wheezing.   Cardiovascular:  Negative for chest pain and palpitations.  Gastrointestinal:  Negative for diarrhea and vomiting.  Skin:  Negative for color change and rash.  All other systems reviewed and are negative.    Physical Exam Triage Vital Signs ED Triage Vitals  Enc Vitals Group     BP      Pulse      Resp      Temp      Temp src      SpO2      Weight      Height      Head Circumference      Peak Flow      Pain Score      Pain Loc      Pain Edu?      Excl. in [place]?    No data found.  Updated Vital Signs BP (!) 131/93   Pulse (!) 114   Temp 98 F (36.7 C)   Resp 18   LMP  (LMP Unknown)   SpO2 97%   Visual Acuity Right Eye Distance:   Left Eye Distance:   Bilateral Distance:    Right Eye Near:   Left Eye Near:    Bilateral Near:     Physical Exam Vitals and nursing note reviewed.  Constitutional:      General: She is not in acute distress.    Appearance: She is well-developed. She is obese. She is not ill-appearing.  HENT:     Right Ear: Tympanic membrane normal.     Left Ear: Tympanic membrane normal.     Nose: Nose normal.     Mouth/Throat:     Mouth: Mucous membranes are moist.     Pharynx: Oropharynx is clear.  Eyes:     Conjunctiva/sclera: Conjunctivae normal.  Cardiovascular:     Rate and Rhythm: Normal rate and regular rhythm.     Heart  sounds: Normal heart sounds.  Pulmonary:     Effort: Pulmonary effort is normal. No respiratory distress.     Breath sounds: Normal  breath sounds. No wheezing.     Comments: No respiratory distress. Musculoskeletal:     Cervical back: Neck supple.  Skin:    General: Skin is warm and dry.  Neurological:     General: No focal deficit present.     Mental Status: She is alert and oriented to person, place, and time.     Gait: Gait normal.  Psychiatric:        Mood and Affect: Mood normal.        Behavior: Behavior normal.      UC Treatments / Results  Labs (all labs ordered are listed, but only abnormal results are displayed) Labs Reviewed - No data to display  EKG   Radiology No results found.  Procedures Procedures (including critical care time)  Medications Ordered in UC Medications - No data to display  Initial Impression / Assessment and Plan / UC Course  I have reviewed the triage vital signs and the nursing notes.  Pertinent labs & imaging results that were available during my care of the patient were reviewed by me and considered in my medical decision making (see chart for details).    Mild intermittent asthma exacerbation.  Patient is well-appearing and her exam is reassuring.  No respiratory distress, lungs are clear, O2 sat 97% on room air.  Treating with albuterol inhaler, prednisone, Tessalon Perles.  Instructed her to follow-up with her PCP.  Education provided on asthma.  She agrees to plan of care.  Final Clinical Impressions(s) / UC Diagnoses   Final diagnoses:  Mild intermittent asthma with acute exacerbation     Discharge Instructions      Use the inhaler and take the prednisone as directed.  Follow up with your primary care provider.        ED Prescriptions     Medication Sig Dispense Auth. Provider   albuterol (VENTOLIN HFA) 108 (90 Base) MCG/ACT inhaler Inhale 1-2 puffs into the lungs every 6 (six) hours as needed for wheezing or shortness of breath. 18 g Sharion Balloon, NP   predniSONE (DELTASONE) 10 MG tablet Take 4 tablets (40 mg total) by mouth daily for 5 days.  20 tablet Sharion Balloon, NP   benzonatate (TESSALON) 100 MG capsule Take 1 capsule (100 mg total) by mouth 3 (three) times daily as needed for cough. 21 capsule Sharion Balloon, NP      PDMP not reviewed this encounter.   Sharion Balloon, NP 02/01/22 [phone]

## 2022-02-01 NOTE — ED Triage Notes (Signed)
Pt presents with SOB, dizziness and cough since yesterday.

## 2022-03-20 ENCOUNTER — Ambulatory Visit
Admission: EM | Admit: 2022-03-20 | Discharge: 2022-03-20 | Disposition: A | Discharge status: Home / Self Care | Payer: BC Managed Care – PPO | Attending: Emergency Medicine | Admitting: Emergency Medicine

## 2022-03-20 DIAGNOSIS — R300 Dysuria: Secondary | ICD-10-CM

## 2022-03-20 DIAGNOSIS — N898 Other specified noninflammatory disorders of vagina: Secondary | ICD-10-CM | POA: Diagnosis present

## 2022-03-20 DIAGNOSIS — N760 Acute vaginitis: Secondary | ICD-10-CM | POA: Insufficient documentation

## 2022-03-20 LAB — POCT URINALYSIS DIP (MANUAL ENTRY)
Bilirubin, UA: NEGATIVE
Glucose, UA: NEGATIVE mg/dL
Ketones, POC UA: NEGATIVE mg/dL
Nitrite, UA: NEGATIVE
Spec Grav, UA: 1.025 (ref 1.010–1.025)
Urobilinogen, UA: 0.2 E.U./dL
pH, UA: 6 (ref 5.0–8.0)

## 2022-03-20 LAB — POCT URINE PREGNANCY: Preg Test, Ur: NEGATIVE

## 2022-03-20 MED ORDER — FLUCONAZOLE 150 MG PO TABS: 150.0000 mg | ORAL_TABLET | Freq: Every day | ORAL | 0 refills | Status: DC

## 2022-03-20 MED ORDER — METRONIDAZOLE 500 MG PO TABS: 500.0000 mg | ORAL_TABLET | Freq: Two times a day (BID) | ORAL | 0 refills | Status: DC

## 2022-03-20 NOTE — Discharge Instructions (Addendum)
Urine culture pending.   Take the Diflucan and Flagyl as directed.      Your vaginal tests are pending.  If your test results are positive, we will call you.  You and your sexual partner(s) may require treatment at that time.  Do not have sexual activity for at least 7 days.    Follow up with your primary care provider or gynecologist if your symptoms are not improving.

## 2022-03-20 NOTE — ED Triage Notes (Addendum)
Patient to Urgent Care with complaints of vaginal discharge and dysuria/ urinary frequency. Reports symptoms started last week. No concern for STDs.

## 2022-03-20 NOTE — ED Provider Notes (Signed)
Catherine Waters    CSN: [id number] Arrival date & time: 03/20/22  1726      History   Chief Complaint Chief Complaint  Patient presents with   Vaginal Discharge    HPI Catherine Waters is a 36 y.o. female.  Patient presents with 1 week history of dysuria, urine frequency, white vaginal discharge.  She states her symptoms are similar to previous bacterial vaginitis or yeast or UTI.  She denies fever, chills, abdominal pain, nausea, vomiting, diarrhea, pelvic pain, or other symptoms.  Her medical history includes hypertension and asthma.  She also has had recurrent vaginitis.  The history is provided by the patient and medical records.    Past Medical History:  Diagnosis Date   Asthma    Hypertension    Protein C deficiency [facility])     Patient Active Problem List   Diagnosis Date Noted   Pelvic pressure in pregnancy, antepartum, third trimester 09/27/2018    Past Surgical History:  Procedure Laterality Date   NO PAST SURGERIES     TUBAL LIGATION      OB History     Gravida  2   Para  1   Term      Preterm  1   AB      Living  1      SAB      IAB      Ectopic      Multiple      Live Births  1            Home Medications    Prior to Admission medications   Medication Sig Start Date End Date Taking? Authorizing Provider  fluconazole (DIFLUCAN) 150 MG tablet Take 1 tablet (150 mg total) by mouth daily. Take one tablet today.  May repeat in 3 days. 03/20/22  Yes Sharion Balloon, NP  metroNIDAZOLE (FLAGYL) 500 MG tablet Take 1 tablet (500 mg total) by mouth 2 (two) times daily. 03/20/22  Yes Sharion Balloon, NP  albuterol (VENTOLIN HFA) 108 (90 Base) MCG/ACT inhaler Inhale 1-2 puffs into the lungs every 6 (six) hours as needed for wheezing or shortness of breath. 02/01/22   Sharion Balloon, NP  aspirin EC 81 MG tablet Take 81 mg by mouth daily.    [provider]  benzonatate (TESSALON) 100 MG capsule Take 1 capsule (100 mg total) by mouth 3  (three) times daily as needed for cough. 02/01/22   Sharion Balloon, NP  famotidine (PEPCID) 20 MG tablet Take by mouth.    [provider]  ibuprofen (ADVIL) 600 MG tablet Take 600 mg by mouth every 6 (six) hours as needed. 06/19/21   [provider]  levonorgestrel (MIRENA) 20 MCG/DAY IUD 1 each by Intrauterine route once.    [provider]  loratadine (CLARITIN) 10 MG tablet Take 10 mg by mouth daily.    [provider]  melatonin 3 MG TABS tablet Take by mouth. 03/12/18   [provider]  NIFEdipine (ADALAT CC) 60 MG 24 hr tablet Take 60 mg by mouth daily.    [provider]  ondansetron (ZOFRAN ODT) 4 MG disintegrating tablet Take 1 tablet (4 mg total) by mouth every 8 (eight) hours as needed for nausea or vomiting. 09/27/18   Paulette Blanch, MD  OZEMPIC, 0.25 OR 0.5 MG/DOSE, 2 MG/1.5ML SOPN Inject into the skin. 08/03/21   [provider]  potassium chloride SA (KLOR-CON M) 20 MEQ tablet  09/03/20  [provider]  Prenatal Vit-Fe Fumarate-FA (MULTIVITAMIN-PRENATAL) 27-0.8 MG TABS tablet Take 1 tablet by mouth daily at 12 noon.    [provider]  sertraline (ZOLOFT) 50 MG tablet Take 1 tablet by mouth daily. 03/15/20   [provider]  SUMAtriptan (IMITREX) 50 MG tablet SMARTSIG:1 Tablet(s) By Mouth 01/29/22   [provider]  topiramate (TOPAMAX) 50 MG tablet Take 50 mg by mouth daily. 08/03/21   [provider]    Family History History reviewed. No pertinent family history.  Social History Social History   Tobacco Use   Smoking status: Never   Smokeless tobacco: Never  Vaping Use   Vaping Use: Never used  Substance Use Topics   Alcohol use: Not Currently   Drug use: Never     Allergies   Minocycline   Review of Systems Review of Systems  Constitutional:  Negative for chills and fever.  Gastrointestinal:  Negative for abdominal pain, diarrhea, nausea and vomiting.   Genitourinary:  Positive for dysuria, frequency and vaginal discharge. Negative for flank pain, hematuria and pelvic pain.  Skin:  Negative for rash.  All other systems reviewed and are negative.    Physical Exam Triage Vital Signs ED Triage Vitals  Enc Vitals Group     BP      Pulse      Resp      Temp      Temp src      SpO2      Weight      Height      Head Circumference      Peak Flow      Pain Score      Pain Loc      Pain Edu?      Excl. in [place]?    No data found.  Updated Vital Signs BP (!) 134/91   Pulse (!) 104   Temp 98.4 F (36.9 C)   Resp 18   Ht '5\' 3"'$  (1.6 m)   Wt 225 lb (102.1 kg)   SpO2 98%   BMI 39.86 kg/m   Visual Acuity Right Eye Distance:   Left Eye Distance:   Bilateral Distance:    Right Eye Near:   Left Eye Near:    Bilateral Near:     Physical Exam Vitals and nursing note reviewed.  Constitutional:      General: She is not in acute distress.    Appearance: Normal appearance. She is well-developed. She is not ill-appearing.  HENT:     Mouth/Throat:     Mouth: Mucous membranes are moist.  Cardiovascular:     Rate and Rhythm: Normal rate and regular rhythm.     Heart sounds: Normal heart sounds.  Pulmonary:     Effort: Pulmonary effort is normal. No respiratory distress.     Breath sounds: Normal breath sounds.  Abdominal:     General: Bowel sounds are normal.     Palpations: Abdomen is soft.     Tenderness: There is no abdominal tenderness. There is no right CVA tenderness, left CVA tenderness, guarding or rebound.  Musculoskeletal:     Cervical back: Neck supple.  Skin:    General: Skin is warm and dry.  Neurological:     Mental Status: She is alert.  Psychiatric:        Mood and Affect: Mood normal.        Behavior: Behavior normal.      UC Treatments / Results  Labs (all  labs ordered are listed, but only abnormal results are displayed) Labs Reviewed  POCT URINALYSIS DIP (MANUAL ENTRY) - Abnormal; Notable for  the following components:      Result Value   Clarity, UA cloudy (*)    Blood, UA trace-lysed (*)    Protein Ur, POC trace (*)    Leukocytes, UA Trace (*)    All other components within normal limits  URINE CULTURE  POCT URINE PREGNANCY  CERVICOVAGINAL ANCILLARY ONLY    EKG   Radiology No results found.  Procedures Procedures (including critical care time)  Medications Ordered in UC Medications - No data to display  Initial Impression / Assessment and Plan / UC Course  I have reviewed the triage vital signs and the nursing notes.  Pertinent labs & imaging results that were available during my care of the patient were reviewed by me and considered in my medical decision making (see chart for details).   Dysuria, vaginal discharge.  Urine culture pending.  Urine pregnancy negative.  Patient obtained vaginal self swab for testing.  Treating with fluconazole and metronidazole.  Discussed that we will call if test results are positive.  Discussed that she may require additional treatment at that time.  Discussed that sexual partner(s) may also require treatment.  Instructed patient to abstain from sexual activity for at least 7 days.  Instructed her to follow-up with her PCP or gynecologist if her symptoms are not improving.  Patient agrees to plan of care.    Final Clinical Impressions(s) / UC Diagnoses   Final diagnoses:  Dysuria  Vaginal discharge     Discharge Instructions      Urine culture pending.   Take the Diflucan and Flagyl as directed.      Your vaginal tests are pending.  If your test results are positive, we will call you.  You and your sexual partner(s) may require treatment at that time.  Do not have sexual activity for at least 7 days.    Follow up with your primary care provider or gynecologist if your symptoms are not improving.        ED Prescriptions     Medication Sig Dispense Auth. Provider   fluconazole (DIFLUCAN) 150 MG tablet Take 1  tablet (150 mg total) by mouth daily. Take one tablet today.  May repeat in 3 days. 2 tablet Sharion Balloon, NP   metroNIDAZOLE (FLAGYL) 500 MG tablet Take 1 tablet (500 mg total) by mouth 2 (two) times daily. 14 tablet Sharion Balloon, NP      PDMP not reviewed this encounter.   Sharion Balloon, NP 03/20/22 [phone]

## 2022-03-22 LAB — URINE CULTURE

## 2022-03-22 LAB — CERVICOVAGINAL ANCILLARY ONLY
Bacterial Vaginitis (gardnerella): POSITIVE — AB
Candida Glabrata: NEGATIVE
Candida Vaginitis: NEGATIVE
Chlamydia: NEGATIVE
Comment: NEGATIVE
Comment: NEGATIVE
Comment: NEGATIVE
Comment: NEGATIVE
Comment: NEGATIVE
Comment: NORMAL
Neisseria Gonorrhea: NEGATIVE
Trichomonas: NEGATIVE

## 2022-05-05 IMAGING — CR DG CHEST 2V
2 series · 2 of 2 positions shown · non-contrast
Comparison: None.

CLINICAL DATA: Chest pain.

EXAM:
CHEST - 2 VIEW

[chest pa]
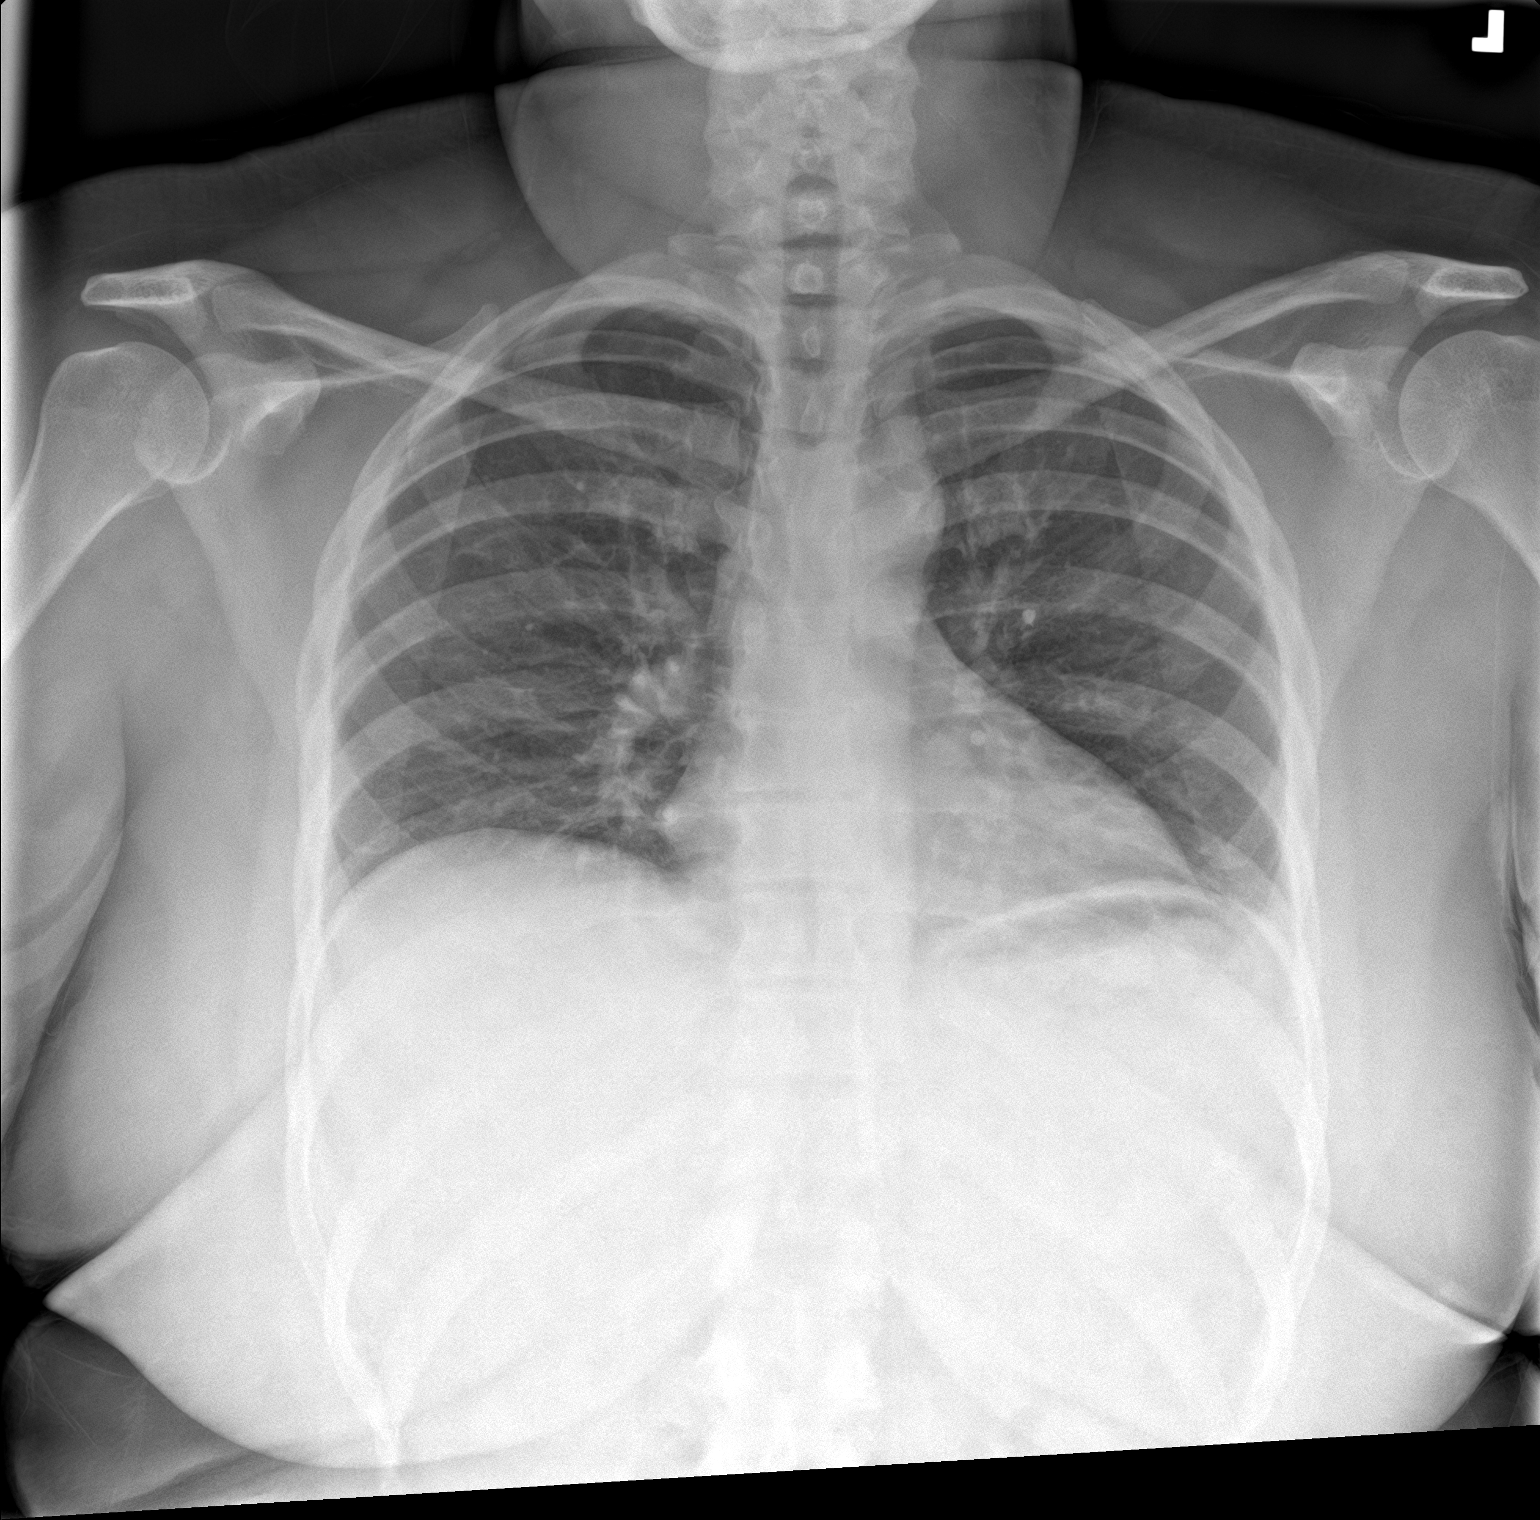

[chest lat]
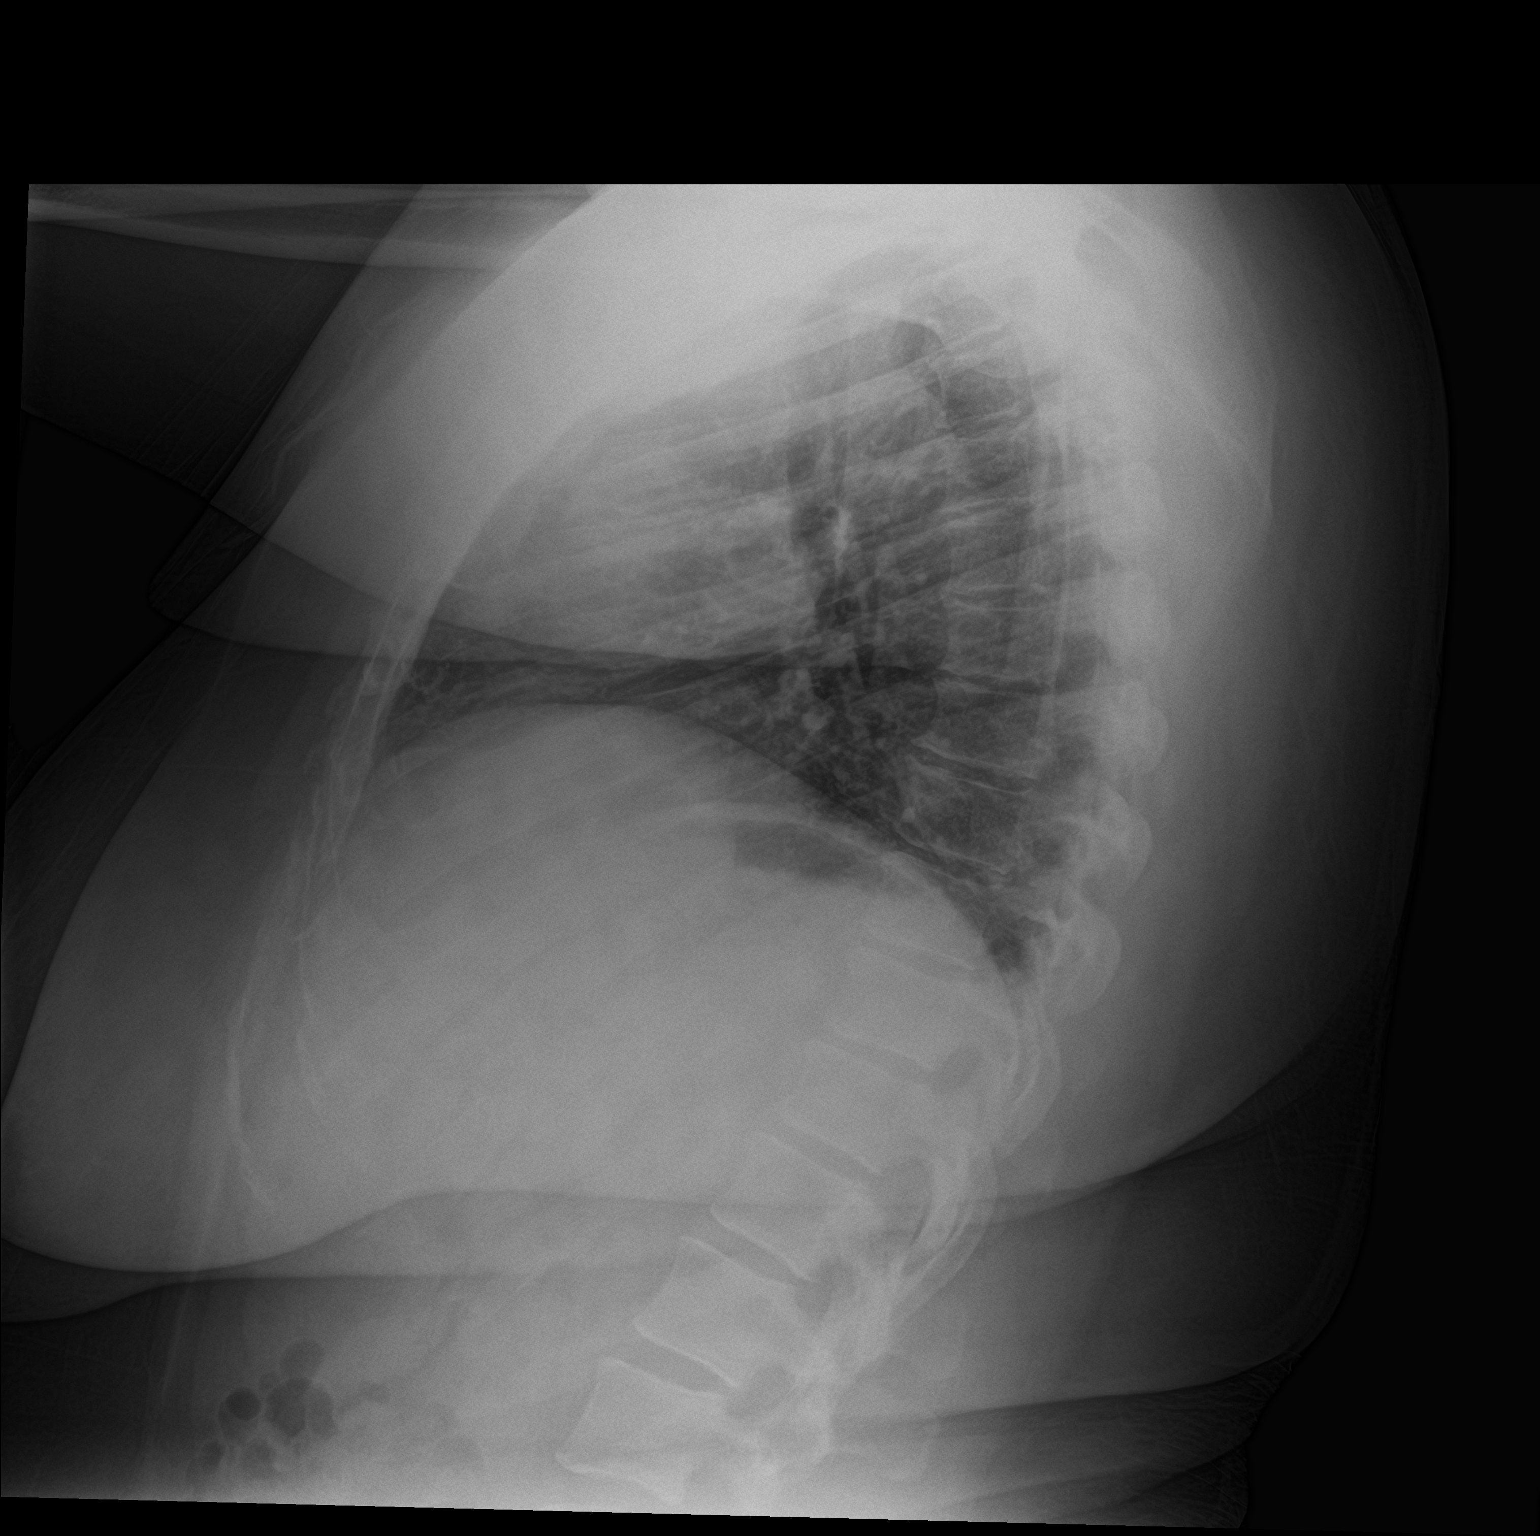

[2 of 2 positions shown; findings below may reference images not displayed]

FINDINGS: The heart size and mediastinal contours are within normal limits. No
consolidation. No pleural effusions. No discernible pneumothorax.
Low lung volumes. The visualized skeletal structures are
unremarkable.
IMPRESSION: No acute cardiopulmonary disease.

## 2022-08-03 ENCOUNTER — Emergency Department
Admission: EM | Admit: 2022-08-03 | Discharge: 2022-08-03 | Discharge status: Against Medical Advice | Payer: Medicaid Other | Attending: Emergency Medicine | Admitting: Emergency Medicine

## 2022-08-03 DIAGNOSIS — Z5321 Procedure and treatment not carried out due to patient leaving prior to being seen by health care provider: Secondary | ICD-10-CM | POA: Diagnosis not present

## 2022-08-03 NOTE — ED Notes (Signed)
Pt to triage 1 and stated she did not want to wait hours to be seen and she will go to [facility].  Pt ambulated out of Triage and lobby

## 2022-08-04 ENCOUNTER — Encounter: Payer: Self-pay | Admitting: Emergency Medicine

## 2022-08-04 ENCOUNTER — Emergency Department: Payer: BC Managed Care – PPO

## 2022-08-04 ENCOUNTER — Other Ambulatory Visit: Payer: Self-pay

## 2022-08-04 ENCOUNTER — Emergency Department
Admission: EM | Admit: 2022-08-04 | Discharge: 2022-08-04 | Disposition: A | Discharge status: Home / Self Care | Payer: BC Managed Care – PPO | Attending: Emergency Medicine | Admitting: Emergency Medicine

## 2022-08-04 DIAGNOSIS — I10 Essential (primary) hypertension: Secondary | ICD-10-CM | POA: Insufficient documentation

## 2022-08-04 DIAGNOSIS — D329 Benign neoplasm of meninges, unspecified: Secondary | ICD-10-CM

## 2022-08-04 DIAGNOSIS — J45909 Unspecified asthma, uncomplicated: Secondary | ICD-10-CM | POA: Insufficient documentation

## 2022-08-04 DIAGNOSIS — B9689 Other specified bacterial agents as the cause of diseases classified elsewhere: Secondary | ICD-10-CM | POA: Insufficient documentation

## 2022-08-04 DIAGNOSIS — D320 Benign neoplasm of cerebral meninges: Secondary | ICD-10-CM | POA: Diagnosis not present

## 2022-08-04 DIAGNOSIS — R519 Headache, unspecified: Secondary | ICD-10-CM | POA: Insufficient documentation

## 2022-08-04 DIAGNOSIS — N760 Acute vaginitis: Secondary | ICD-10-CM | POA: Diagnosis not present

## 2022-08-04 LAB — CBC WITH DIFFERENTIAL/PLATELET
Abs Immature Granulocytes: 0.03 10*3/uL (ref 0.00–0.07)
Basophils Absolute: 0.1 10*3/uL (ref 0.0–0.1)
Basophils Relative: 1 %
Eosinophils Absolute: 0.1 10*3/uL (ref 0.0–0.5)
Eosinophils Relative: 1 %
HCT: 38.1 % (ref 36.0–46.0)
Hemoglobin: 12.8 g/dL (ref 12.0–15.0)
Immature Granulocytes: 0 %
Lymphocytes Relative: 40 %
Lymphs Abs: 3.5 10*3/uL (ref 0.7–4.0)
MCH: 30.1 pg (ref 26.0–34.0)
MCHC: 33.6 g/dL (ref 30.0–36.0)
MCV: 89.6 fL (ref 80.0–100.0)
Monocytes Absolute: 0.5 10*3/uL (ref 0.1–1.0)
Monocytes Relative: 6 %
Neutro Abs: 4.7 10*3/uL (ref 1.7–7.7)
Neutrophils Relative %: 52 %
Platelets: 338 10*3/uL (ref 150–400)
RBC: 4.25 MIL/uL (ref 3.87–5.11)
RDW: 12.7 % (ref 11.5–15.5)
WBC: 8.9 10*3/uL (ref 4.0–10.5)
nRBC: 0 % (ref 0.0–0.2)

## 2022-08-04 LAB — URINALYSIS, ROUTINE W REFLEX MICROSCOPIC
Bilirubin Urine: NEGATIVE
Glucose, UA: NEGATIVE mg/dL
Ketones, ur: NEGATIVE mg/dL
Nitrite: NEGATIVE
Protein, ur: NEGATIVE mg/dL
Specific Gravity, Urine: 1.016 (ref 1.005–1.030)
pH: 7 (ref 5.0–8.0)

## 2022-08-04 LAB — COMPREHENSIVE METABOLIC PANEL
ALT: 13 U/L (ref 0–44)
AST: 16 U/L (ref 15–41)
Albumin: 4 g/dL (ref 3.5–5.0)
Alkaline Phosphatase: 69 U/L (ref 38–126)
Anion gap: 6 (ref 5–15)
BUN: 10 mg/dL (ref 6–20)
CO2: 26 mmol/L (ref 22–32)
Calcium: 8.9 mg/dL (ref 8.9–10.3)
Chloride: 105 mmol/L (ref 98–111)
Creatinine, Ser: 0.63 mg/dL (ref 0.44–1.00)
GFR, Estimated: >60 mL/min (ref >60)
Glucose, Bld: 81 mg/dL (ref 70–99)
Potassium: 3.5 mmol/L (ref 3.5–5.1)
Sodium: 137 mmol/L (ref 135–145)
Total Bilirubin: 1.1 mg/dL (ref 0.3–1.2)
Total Protein: 7.7 g/dL (ref 6.5–8.1)

## 2022-08-04 LAB — WET PREP, GENITAL
Sperm: NONE SEEN
Trich, Wet Prep: NONE SEEN
WBC, Wet Prep HPF POC: <10 (ref <10)
Yeast Wet Prep HPF POC: NONE SEEN

## 2022-08-04 LAB — CHLAMYDIA/NGC RT PCR (ARMC ONLY)
Chlamydia Tr: NOT DETECTED
N gonorrhoeae: NOT DETECTED

## 2022-08-04 LAB — POC URINE PREG, ED: Preg Test, Ur: NEGATIVE

## 2022-08-04 MED ORDER — METRONIDAZOLE 500 MG PO TABS: 500.0000 mg | ORAL_TABLET | Freq: Three times a day (TID) | ORAL | 0 refills | Status: AC

## 2022-08-04 MED ORDER — KETOROLAC TROMETHAMINE 30 MG/ML IJ SOLN
30.0000 mg | Freq: Once | INTRAMUSCULAR | Status: AC
  Administered 2022-08-04: 30 mg via INTRAVENOUS
  Filled 2022-08-04: qty 1

## 2022-08-04 MED ORDER — ONDANSETRON HCL 4 MG/2ML IJ SOLN
4.0000 mg | Freq: Once | INTRAMUSCULAR | Status: AC
  Administered 2022-08-04: 4 mg via INTRAVENOUS
  Filled 2022-08-04: qty 2

## 2022-08-04 MED ORDER — SODIUM CHLORIDE 0.9 % IV BOLUS
1000.0000 mL | Freq: Once | INTRAVENOUS | Status: AC
  Administered 2022-08-04: 1000 mL via INTRAVENOUS

## 2022-08-04 NOTE — ED Triage Notes (Signed)
Pt to ER with c/o headache for last 2-3 weeks.  States she talked with her PCP at [facility] and he advised that she come to ER for a CT scan because she has a protein C deficiency and has had some pelvic pain as well.

## 2022-08-04 NOTE — Discharge Instructions (Signed)
Take all of your regular medication as prescribed.  Take the Flagyl as prescribed for bacterial vaginosis. Your CT scan showed a benign meningioma.  However still want you to discuss this with your neurologist.  Return emergency department for worsening

## 2022-08-04 NOTE — ED Provider Notes (Signed)
[facility] Provider Note    Event Date/Time   First MD Initiated Contact with Patient 08/04/22 1211     (approximate)   History   Headache   HPI  Catherine Waters is a 37 y.o. female with history of protein C deficiency hypertension, asthma and sleep apnea presents emergency department with headache for the past 2 to 3 weeks.  Patient states she had a sleep study but has not gotten CPAP machine yet.  States wakes up with headache and they think it may be due to sleep apnea but are concerned due to her protein C deficiency.  Would like for her to get a CT scan.  She also has some pelvic pain with vaginal spotting.  Patient has had tubal ligation and is on birth control.  She denies any vomiting or diarrhea.  Has photosensitivity due to the migraine.      Physical Exam   Triage Vital Signs: ED Triage Vitals  Enc Vitals Group     BP 08/04/22 1148 (!) 161/109     Pulse Rate 08/04/22 1148 100     Resp 08/04/22 1148 18     Temp 08/04/22 1148 98.7 F (37.1 C)     Temp src --      SpO2 08/04/22 1148 97 %     Weight 08/04/22 1149 234 lb (106.1 kg)     Height 08/04/22 1149 '5\' 3"'$  (1.6 m)     Head Circumference --      Peak Flow --      Pain Score 08/04/22 1149 8     Pain Loc --      Pain Edu? --      Excl. in [place]? --     Most recent vital signs: Vitals:   08/04/22 1148  BP: (!) 161/109  Pulse: 100  Resp: 18  Temp: 98.7 F (37.1 C)  SpO2: 97%     General: Awake, no distress.   CV:  Good peripheral perfusion. regular rate and  rhythm Resp:  Normal effort.  Abd:  No distention. nontender  Other:  Cranial nerves II through XII grossly intact, grips equal bilaterally, maxillary and frontal sinuses are not tender to palpation, neck is supple, no lymphadenopathy, nontender in the paravertebral muscles or shoulder   ED Results / Procedures / Treatments   Labs (all labs ordered are listed, but only abnormal results are displayed) Labs Reviewed  WET  PREP, GENITAL - Abnormal; Notable for the following components:      Result Value   Clue Cells Wet Prep HPF POC PRESENT (*)    All other components within normal limits  URINALYSIS, ROUTINE W REFLEX MICROSCOPIC - Abnormal; Notable for the following components:   Color, Urine YELLOW (*)    APPearance CLOUDY (*)    Hgb urine dipstick MODERATE (*)    Leukocytes,Ua MODERATE (*)    Bacteria, UA RARE (*)    All other components within normal limits  CHLAMYDIA/NGC RT PCR ([facility] ONLY)            COMPREHENSIVE METABOLIC PANEL  CBC WITH DIFFERENTIAL/PLATELET  POC URINE PREG, ED     EKG     RADIOLOGY CT of the head    PROCEDURES:   Procedures   MEDICATIONS ORDERED IN ED: Medications  sodium chloride 0.9 % bolus 1,000 mL (0 mLs Intravenous Stopped 08/04/22 1439)  ketorolac (TORADOL) 30 MG/ML injection 30 mg (30 mg Intravenous Given 08/04/22 1301)  ondansetron (ZOFRAN) injection 4 mg (  4 mg Intravenous Given 08/04/22 1300)     IMPRESSION / MDM / ASSESSMENT AND PLAN / ED COURSE  I reviewed the triage vital signs and the nursing notes.                              Differential diagnosis includes, but is not limited to, migraine, sinusitis, SAH, subdural, CVA, pelvic pain  Patient's presentation is most consistent with acute complicated illness / injury requiring diagnostic workup.   Due to the patient's symptoms been ongoing for 2 to 3 weeks until clear she has SAH or subdural.  However we will still continue with the CT of the head due to the increasing frequency and severity of her migraines  Basic labs obtained due to pelvic pain.   Labs are reassuring other than positive for clue cells on wet prep  Will treat patient for bacterial vaginosis  CT of the head independently reviewed and interpreted by me as having a benign meningioma.  Radiologist states benign calcification.  I did discuss this with the patient.  She is feeling better with her migraine symptoms.  I do feel she  needs to still follow-up with her neurologist.  She has a prescription for Imitrex she has not picked up yet she should start taking this medication.  Return emergency department worsening.  She is in agreement treatment plan.  Discharged stable condition.   FINAL CLINICAL IMPRESSION(S) / ED DIAGNOSES   Final diagnoses:  Bad headache  Meningioma ([place])  Bacterial vaginosis     Rx / DC Orders   ED Discharge Orders          Ordered    metroNIDAZOLE (FLAGYL) 500 MG tablet  3 times daily        08/04/22 1506             Note:  This document was prepared using Dragon voice recognition software and may include unintentional dictation errors.    Versie Starks, PA-C 08/04/22 1509    Lavonia Drafts, MD 08/05/22 [phone]

## 2022-08-07 ENCOUNTER — Encounter: Payer: Self-pay | Admitting: Emergency Medicine

## 2022-08-07 ENCOUNTER — Emergency Department: Payer: BC Managed Care – PPO

## 2022-08-07 ENCOUNTER — Emergency Department
Admission: EM | Admit: 2022-08-07 | Discharge: 2022-08-07 | Disposition: A | Discharge status: Home / Self Care | Payer: BC Managed Care – PPO | Attending: Emergency Medicine | Admitting: Emergency Medicine

## 2022-08-07 DIAGNOSIS — G43709 Chronic migraine without aura, not intractable, without status migrainosus: Secondary | ICD-10-CM | POA: Diagnosis not present

## 2022-08-07 DIAGNOSIS — Z1152 Encounter for screening for COVID-19: Secondary | ICD-10-CM | POA: Insufficient documentation

## 2022-08-07 DIAGNOSIS — R519 Headache, unspecified: Secondary | ICD-10-CM | POA: Diagnosis present

## 2022-08-07 LAB — BASIC METABOLIC PANEL
Anion gap: 6 (ref 5–15)
BUN: 10 mg/dL (ref 6–20)
CO2: 29 mmol/L (ref 22–32)
Calcium: 9.1 mg/dL (ref 8.9–10.3)
Chloride: 103 mmol/L (ref 98–111)
Creatinine, Ser: 0.66 mg/dL (ref 0.44–1.00)
GFR, Estimated: >60 mL/min (ref >60)
Glucose, Bld: 91 mg/dL (ref 70–99)
Potassium: 3.7 mmol/L (ref 3.5–5.1)
Sodium: 138 mmol/L (ref 135–145)

## 2022-08-07 LAB — RESP PANEL BY RT-PCR (RSV, FLU A&B, COVID)  RVPGX2
Influenza A by PCR: NEGATIVE
Influenza B by PCR: NEGATIVE
Resp Syncytial Virus by PCR: NEGATIVE
SARS Coronavirus 2 by RT PCR: NEGATIVE

## 2022-08-07 LAB — CBC WITH DIFFERENTIAL/PLATELET
Abs Immature Granulocytes: 0.02 10*3/uL (ref 0.00–0.07)
Basophils Absolute: 0.1 10*3/uL (ref 0.0–0.1)
Basophils Relative: 1 %
Eosinophils Absolute: 0.1 10*3/uL (ref 0.0–0.5)
Eosinophils Relative: 1 %
HCT: 41.2 % (ref 36.0–46.0)
Hemoglobin: 13.4 g/dL (ref 12.0–15.0)
Immature Granulocytes: 0 %
Lymphocytes Relative: 31 %
Lymphs Abs: 2.8 10*3/uL (ref 0.7–4.0)
MCH: 29.4 pg (ref 26.0–34.0)
MCHC: 32.5 g/dL (ref 30.0–36.0)
MCV: 90.4 fL (ref 80.0–100.0)
Monocytes Absolute: 0.7 10*3/uL (ref 0.1–1.0)
Monocytes Relative: 7 %
Neutro Abs: 5.6 10*3/uL (ref 1.7–7.7)
Neutrophils Relative %: 60 %
Platelets: 350 10*3/uL (ref 150–400)
RBC: 4.56 MIL/uL (ref 3.87–5.11)
RDW: 12.6 % (ref 11.5–15.5)
WBC: 9.3 10*3/uL (ref 4.0–10.5)
nRBC: 0 % (ref 0.0–0.2)

## 2022-08-07 MED ORDER — IOHEXOL 350 MG/ML SOLN
100.0000 mL | Freq: Once | INTRAVENOUS | Status: AC | PRN
  Administered 2022-08-07: 100 mL via INTRAVENOUS

## 2022-08-07 MED ORDER — BUTALBITAL-APAP-CAFFEINE 50-325-40 MG PO TABS: 1.0000 | ORAL_TABLET | Freq: Four times a day (QID) | ORAL | 0 refills | Status: DC | PRN

## 2022-08-07 MED ORDER — ACETAZOLAMIDE 250 MG PO TABS: 500.0000 mg | ORAL_TABLET | Freq: Two times a day (BID) | ORAL | 0 refills | Status: DC

## 2022-08-07 MED ORDER — DIPHENHYDRAMINE HCL 50 MG/ML IJ SOLN
25.0000 mg | Freq: Once | INTRAMUSCULAR | Status: AC
  Administered 2022-08-07: 25 mg via INTRAVENOUS
  Filled 2022-08-07: qty 1

## 2022-08-07 MED ORDER — ACETAZOLAMIDE 250 MG PO TABS: 500.0000 mg | ORAL_TABLET | Freq: Two times a day (BID) | ORAL | 0 refills | Status: AC

## 2022-08-07 MED ORDER — BUTALBITAL-APAP-CAFFEINE 50-325-40 MG PO TABS: 1.0000 | ORAL_TABLET | Freq: Four times a day (QID) | ORAL | 0 refills | Status: AC | PRN

## 2022-08-07 MED ORDER — ONDANSETRON 4 MG PO TBDP
4.0000 mg | ORAL_TABLET | Freq: Once | ORAL | Status: AC
  Administered 2022-08-07: 4 mg via ORAL
  Filled 2022-08-07: qty 1

## 2022-08-07 MED ORDER — HALOPERIDOL LACTATE 5 MG/ML IJ SOLN
2.5000 mg | Freq: Once | INTRAMUSCULAR | Status: AC
  Administered 2022-08-07: 2.5 mg via INTRAVENOUS
  Filled 2022-08-07: qty 1

## 2022-08-07 NOTE — Discharge Instructions (Addendum)
As we discussed, we are going to start a low-dose medication to help prevent your headaches.  This medication may make you urinate more frequently so it is important stay hydrated.  Take the Fioricet as needed for severe pain.  Call the neurology office above to set an appointment with Dr. Melrose Nakayama or one of his partners, ideally within the next 1 to 3 weeks.  If symptoms do not resolve or if you begin to develop blurry or changes in your vision, return to the ER.

## 2022-08-07 NOTE — ED Triage Notes (Signed)
Pt presents via POV with complaints of migraines - hx of same. Pt states she's had it for the last  2-3 weeks w/ associated night sweats. She's tried OTC medication without relief. Denies CP or SOB.

## 2022-08-07 NOTE — ED Notes (Signed)
Pt resting in bed with no distress noted. States has headache hx of migraines feels the same.

## 2022-08-07 NOTE — ED Provider Notes (Signed)
[facility] Provider Note    Event Date/Time   First MD Initiated Contact with Patient 08/07/22 0830     (approximate)   History   Migraine   HPI  Catherine Waters is a 37 y.o. female   with past medical history of chronic headaches here with aching, throbbing, generalized headache.  The patient has been seen twice for this headache.  She had a negative CT scan.  She states the pain has greatly persisted and remained severe.  It is aching, throbbing, but mostly localized on 1 side.  She has a chronic history of migraines and was taken off of Topamax about a month and a half ago at which point her headaches became more frequent.  She states she was taken off of this due to abnormal medications.  Denies any focal numbness or weakness.  No recent head trauma.  No fevers or chills.  No neck pain or neck stiffness.      Physical Exam   Triage Vital Signs: ED Triage Vitals  Enc Vitals Group     BP 08/07/22 0054 (!) 140/105     Pulse Rate 08/07/22 0054 (!) 105     Resp 08/07/22 0054 18     Temp 08/07/22 0054 98.8 F (37.1 C)     Temp Source 08/07/22 0810 Oral     SpO2 08/07/22 0054 98 %     Weight --      Height --      Head Circumference --      Peak Flow --      Pain Score --      Pain Loc --      Pain Edu? --      Excl. in [place]? --     Most recent vital signs: Vitals:   08/07/22 0810 08/07/22 1051  BP: (!) 153/103 (!) 149/94  Pulse: 98 96  Resp: 16 18  Temp: 98.2 F (36.8 C)   SpO2: 99% 100%     General: Awake, no distress.  CV:  Good peripheral perfusion.  Regular rate and rhythm. Resp:  Normal effort.  Abd:  No distention.  Other:  Cranial nerves II through XII fully intact.  Normal strength and sensation bilateral upper and lower extremities.  No ataxia.  Gait normal.   ED Results / Procedures / Treatments   Labs (all labs ordered are listed, but only abnormal results are displayed) Labs Reviewed  RESP PANEL BY RT-PCR (RSV, FLU  A&B, COVID)  [id number]  CBC WITH DIFFERENTIAL/PLATELET  BASIC METABOLIC PANEL     EKG    RADIOLOGY CT venogram: Partially empty sella, query possible IIH, no dural venous thrombosis   I also independently reviewed and agree with radiologist interpretations.   PROCEDURES:  Critical Care performed: No   MEDICATIONS ORDERED IN ED: Medications  ondansetron (ZOFRAN-ODT) disintegrating tablet 4 mg (4 mg Oral Given 08/07/22 0102)  haloperidol lactate (HALDOL) injection 2.5 mg (2.5 mg Intravenous Given 08/07/22 1011)  diphenhydrAMINE (BENADRYL) injection 25 mg (25 mg Intravenous Given 08/07/22 1004)  iohexol (OMNIPAQUE) 350 MG/ML injection 100 mL (100 mLs Intravenous Contrast Given 08/07/22 1021)     IMPRESSION / MDM / ASSESSMENT AND PLAN / ED COURSE  I reviewed the triage vital signs and the nursing notes.                              Differential diagnosis includes, but is not limited  to, recurrent migraine headache, other primary headache syndrome, idiopathic intracranial hypertension, CNS mass/lesion, dural sinus thrombosis in the setting of her protein C deficiency  Patient's presentation is most consistent with acute presentation with potential threat to life or bodily function.  37 year old otherwise healthy female here with headache.  Suspect acute on chronic migraines with a well-documented straight the same, likely exacerbated by recently discontinuing her Topamax.  She has no focal neurologic deficits.  Of note, the patient has protein C deficiency and given the acuity and persistence of her headache, feel it is reasonable to rule out cavernous/sinus thrombosis.  Patient sent for CT venogram which was negative.  Her CT head without was negative several days ago.  Otherwise, headache is improved with additional medications here.  She has no focal neurologic deficits.  Lab work is reassuring with normal white count.  BMP normal.  No evidence of suggest meningitis or encephalitis.  Will  start her on low-dose Diamox in the setting of possible IIH as this could explain the persistence and severity of her symptoms.  She adamantly denies any vision changes.  Will have her follow-up with neurology which she has an appointment scheduled next month, but will refer for more urgent follow-up at [facility].  FINAL CLINICAL IMPRESSION(S) / ED DIAGNOSES   Final diagnoses:  Chronic migraine without aura without status migrainosus, not intractable     Rx / DC Orders   ED Discharge Orders          Ordered    butalbital-acetaminophen-caffeine (FIORICET) 50-325-40 MG tablet  Every 6 hours PRN        08/07/22 1155    acetaZOLAMIDE (DIAMOX) 250 MG tablet  2 times daily        08/07/22 1155             Note:  This document was prepared using Dragon voice recognition software and may include unintentional dictation errors.   Duffy Bruce, MD 08/07/22 [phone]

## 2022-09-12 ENCOUNTER — Ambulatory Visit
Admission: EM | Admit: 2022-09-12 | Discharge: 2022-09-12 | Disposition: A | Discharge status: Home / Self Care | Payer: BC Managed Care – PPO | Attending: Emergency Medicine | Admitting: Emergency Medicine

## 2022-09-12 DIAGNOSIS — N898 Other specified noninflammatory disorders of vagina: Secondary | ICD-10-CM | POA: Diagnosis not present

## 2022-09-12 DIAGNOSIS — Z3202 Encounter for pregnancy test, result negative: Secondary | ICD-10-CM | POA: Insufficient documentation

## 2022-09-12 DIAGNOSIS — Z113 Encounter for screening for infections with a predominantly sexual mode of transmission: Secondary | ICD-10-CM | POA: Diagnosis present

## 2022-09-12 LAB — POCT URINE PREGNANCY: Preg Test, Ur: NEGATIVE

## 2022-09-12 LAB — POCT URINALYSIS DIP (MANUAL ENTRY)
Bilirubin, UA: NEGATIVE
Glucose, UA: NEGATIVE mg/dL
Ketones, POC UA: NEGATIVE mg/dL
Nitrite, UA: NEGATIVE
Protein Ur, POC: 30 mg/dL — AB
Spec Grav, UA: 1.015 (ref 1.010–1.025)
Urobilinogen, UA: 0.2 E.U./dL
pH, UA: 7 (ref 5.0–8.0)

## 2022-09-12 NOTE — ED Triage Notes (Addendum)
Patient to Urgent Care with complaints of vaginal discharge/ irritation that started yesterday. Reports at times she has had some shooting pains down her left thigh that started one week ago.   Also requests STD testing/ blood work.

## 2022-09-12 NOTE — ED Provider Notes (Signed)
[name]    CSN: [id number] Arrival date & time: 09/12/22  1031      History   Chief Complaint Chief Complaint  Patient presents with   Vaginal Discharge    HPI Catherine Waters is a 37 y.o. female.  Patient presents with 1 day history of vaginal discharge and vaginal irritation.  She request STD testing.  No fever, chills, abdominal pain, flank pain, pelvic pain, or other symptoms.  No treatment at home.  Her medical history includes hypertension, asthma, protein C deficiency.   The history is provided by the patient and medical records.    Past Medical History:  Diagnosis Date   Asthma    Hypertension    Protein C deficiency [facility])     Patient Active Problem List   Diagnosis Date Noted   Pelvic pressure in pregnancy, antepartum, third trimester 09/27/2018    Past Surgical History:  Procedure Laterality Date   NO PAST SURGERIES     TUBAL LIGATION      OB History     Gravida  2   Para  1   Term      Preterm  1   AB      Living  1      SAB      IAB      Ectopic      Multiple      Live Births  1            Home Medications    Prior to Admission medications   Medication Sig Start Date End Date Taking? Authorizing Provider  rizatriptan (MAXALT) 10 MG tablet Take by mouth. 07/29/22  Yes [provider]  topiramate (TOPAMAX) 50 MG tablet Take by mouth. 08/31/21 03/15/23 Yes [provider]  acetaZOLAMIDE (DIAMOX) 250 MG tablet Take 2 tablets (500 mg total) by mouth 2 (two) times daily. 08/07/22 09/06/22  Duffy Bruce, MD  albuterol (VENTOLIN HFA) 108 (90 Base) MCG/ACT inhaler Inhale 1-2 puffs into the lungs every 6 (six) hours as needed for wheezing or shortness of breath. 02/01/22   Sharion Balloon, NP  aspirin EC 81 MG tablet Take 81 mg by mouth daily.    [provider]  benzonatate (TESSALON) 100 MG capsule Take 1 capsule (100 mg total) by mouth 3 (three) times daily as needed for cough. Patient not taking:  Reported on 09/12/2022 02/01/22   Sharion Balloon, NP  butalbital-acetaminophen-caffeine (FIORICET) [phone] MG tablet Take 1-2 tablets by mouth every 6 (six) hours as needed for headache. 08/07/22 08/07/23  Duffy Bruce, MD  famotidine (PEPCID) 20 MG tablet Take by mouth.    [provider]  fluconazole (DIFLUCAN) 150 MG tablet Take 1 tablet (150 mg total) by mouth daily. Take one tablet today.  May repeat in 3 days. Patient not taking: Reported on 09/12/2022 03/20/22   Sharion Balloon, NP  ibuprofen (ADVIL) 600 MG tablet Take 600 mg by mouth every 6 (six) hours as needed. 06/19/21   [provider]  levonorgestrel (MIRENA) 20 MCG/DAY IUD 1 each by Intrauterine route once.    [provider]  loratadine (CLARITIN) 10 MG tablet Take 10 mg by mouth daily.    [provider]  melatonin 3 MG TABS tablet Take by mouth. 03/12/18   [provider]  NIFEdipine (ADALAT CC) 60 MG 24 hr tablet Take 60 mg by mouth daily.    [provider]  ondansetron (ZOFRAN ODT) 4 MG disintegrating tablet Take  1 tablet (4 mg total) by mouth every 8 (eight) hours as needed for nausea or vomiting. 09/27/18   Paulette Blanch, MD  OZEMPIC, 0.25 OR 0.5 MG/DOSE, 2 MG/1.5ML SOPN Inject into the skin. 08/03/21   [provider]  potassium chloride SA (KLOR-CON M) 20 MEQ tablet  09/03/20   [provider]  Prenatal Vit-Fe Fumarate-FA (MULTIVITAMIN-PRENATAL) 27-0.8 MG TABS tablet Take 1 tablet by mouth daily at 12 noon. Patient not taking: Reported on 09/12/2022    [provider]  sertraline (ZOLOFT) 50 MG tablet Take 1 tablet by mouth daily. 03/15/20   [provider]  SUMAtriptan (IMITREX) 50 MG tablet SMARTSIG:1 Tablet(s) By Mouth 01/29/22   [provider]    Family History No family history on file.  Social History Social History   Tobacco Use   Smoking status: Never   Smokeless tobacco: Never  Vaping Use   Vaping Use: Never used   Substance Use Topics   Alcohol use: Not Currently   Drug use: Never     Allergies   Minocycline   Review of Systems Review of Systems  Constitutional:  Negative for chills and fever.  Gastrointestinal:  Negative for abdominal pain, constipation, diarrhea, nausea and vomiting.  Genitourinary:  Positive for vaginal discharge. Negative for dysuria, flank pain, hematuria and pelvic pain.  Skin:  Negative for color change and rash.  All other systems reviewed and are negative.    Physical Exam Triage Vital Signs ED Triage Vitals  Enc Vitals Group     BP 09/12/22 1041 (!) 139/99     Pulse Rate 09/12/22 1041 98     Resp 09/12/22 1041 18     Temp 09/12/22 1041 98.1 F (36.7 C)     Temp src --      SpO2 09/12/22 1041 98 %     Weight --      Height --      Head Circumference --      Peak Flow --      Pain Score 09/12/22 1038 3     Pain Loc --      Pain Edu? --      Excl. in [place]? --    No data found.  Updated Vital Signs BP (!) 139/99   Pulse 98   Temp 98.1 F (36.7 C)   Resp 18   SpO2 98%   Visual Acuity Right Eye Distance:   Left Eye Distance:   Bilateral Distance:    Right Eye Near:   Left Eye Near:    Bilateral Near:     Physical Exam Vitals and nursing note reviewed.  Constitutional:      General: She is not in acute distress.    Appearance: She is well-developed. She is not ill-appearing.  HENT:     Mouth/Throat:     Mouth: Mucous membranes are moist.  Cardiovascular:     Rate and Rhythm: Normal rate and regular rhythm.     Heart sounds: Normal heart sounds.  Pulmonary:     Effort: Pulmonary effort is normal. No respiratory distress.     Breath sounds: Normal breath sounds.  Abdominal:     General: Bowel sounds are normal.     Palpations: Abdomen is soft.     Tenderness: There is no abdominal tenderness. There is no right CVA tenderness, left CVA tenderness, guarding or rebound.  Musculoskeletal:     Cervical back: Neck supple.  Skin:     General: Skin is  warm and dry.  Neurological:     Mental Status: She is alert.  Psychiatric:        Mood and Affect: Mood normal.        Behavior: Behavior normal.      UC Treatments / Results  Labs (all labs ordered are listed, but only abnormal results are displayed) Labs Reviewed  POCT URINALYSIS DIP (MANUAL ENTRY) - Abnormal; Notable for the following components:      Result Value   Clarity, UA cloudy (*)    Blood, UA small (*)    Protein Ur, POC =30 (*)    Leukocytes, UA Trace (*)    All other components within normal limits  HIV ANTIBODY (ROUTINE TESTING W REFLEX)  RPR  POCT URINE PREGNANCY  CERVICOVAGINAL ANCILLARY ONLY    EKG   Radiology No results found.  Procedures Procedures (including critical care time)  Medications Ordered in UC Medications - No data to display  Initial Impression / Assessment and Plan / UC Course  I have reviewed the triage vital signs and the nursing notes.  Pertinent labs & imaging results that were available during my care of the patient were reviewed by me and considered in my medical decision making (see chart for details).    Vaginal discharge and irritation, STD screening, Negative pregnancy test.  Patient obtained vaginal self swab for testing.  HIV and syphilis pending.  Discussed that we will call if test results are positive.  Discussed that she may require treatment at that time.  Discussed that sexual partner(s) may also require treatment.  Instructed patient to abstain from sexual activity for at least 7 days.  Instructed her to follow-up with her PCP or gynecologist if her symptoms are not improving.  Patient agrees to plan of care.   Final Clinical Impressions(s) / UC Diagnoses   Final diagnoses:  Vaginal discharge  Vaginal irritation  Screening for STD (sexually transmitted disease)  Negative pregnancy test     Discharge Instructions      Your tests are pending.  If your test results are positive, we will  call you.  You and your sexual partner(s) may require treatment at that time.  Do not have sexual activity for at least 7 days.    Follow up with your primary care provider if your symptoms are not improving.        ED Prescriptions   None    PDMP not reviewed this encounter.   Sharion Balloon, NP 09/12/22 1102

## 2022-09-12 NOTE — Discharge Instructions (Signed)
Your tests are pending.  If your test results are positive, we will call you.  You and your sexual partner(s) may require treatment at that time.  Do not have sexual activity for at least 7 days.    Follow up with your primary care provider if your symptoms are not improving.

## 2022-09-13 LAB — CERVICOVAGINAL ANCILLARY ONLY
Bacterial Vaginitis (gardnerella): NEGATIVE
Candida Glabrata: NEGATIVE
Candida Vaginitis: POSITIVE — AB
Chlamydia: NEGATIVE
Comment: NEGATIVE
Comment: NEGATIVE
Comment: NEGATIVE
Comment: NEGATIVE
Comment: NEGATIVE
Comment: NORMAL
Neisseria Gonorrhea: NEGATIVE
Trichomonas: NEGATIVE

## 2022-09-13 LAB — HIV ANTIBODY (ROUTINE TESTING W REFLEX): HIV Screen 4th Generation wRfx: NONREACTIVE

## 2022-09-13 LAB — RPR: RPR Ser Ql: NONREACTIVE

## 2022-09-14 ENCOUNTER — Other Ambulatory Visit: Payer: Self-pay

## 2022-09-14 ENCOUNTER — Emergency Department
Admission: EM | Admit: 2022-09-14 | Discharge: 2022-09-15 | Disposition: A | Discharge status: Home / Self Care | Payer: BC Managed Care – PPO | Attending: Emergency Medicine | Admitting: Emergency Medicine

## 2022-09-14 DIAGNOSIS — M5441 Lumbago with sciatica, right side: Secondary | ICD-10-CM

## 2022-09-14 DIAGNOSIS — J45909 Unspecified asthma, uncomplicated: Secondary | ICD-10-CM | POA: Insufficient documentation

## 2022-09-14 DIAGNOSIS — I10 Essential (primary) hypertension: Secondary | ICD-10-CM | POA: Insufficient documentation

## 2022-09-14 DIAGNOSIS — M545 Low back pain, unspecified: Secondary | ICD-10-CM | POA: Diagnosis present

## 2022-09-14 LAB — URINALYSIS, ROUTINE W REFLEX MICROSCOPIC
Bilirubin Urine: NEGATIVE
Glucose, UA: NEGATIVE mg/dL
Ketones, ur: NEGATIVE mg/dL
Nitrite: NEGATIVE
Protein, ur: NEGATIVE mg/dL
Specific Gravity, Urine: 1.026 (ref 1.005–1.030)
pH: 5 (ref 5.0–8.0)

## 2022-09-14 LAB — POC URINE PREG, ED: Preg Test, Ur: NEGATIVE

## 2022-09-14 LAB — BASIC METABOLIC PANEL
Anion gap: 10 (ref 5–15)
BUN: 8 mg/dL (ref 6–20)
CO2: 23 mmol/L (ref 22–32)
Calcium: 8.8 mg/dL — ABNORMAL LOW (ref 8.9–10.3)
Chloride: 104 mmol/L (ref 98–111)
Creatinine, Ser: 0.73 mg/dL (ref 0.44–1.00)
GFR, Estimated: >60 mL/min (ref >60)
Glucose, Bld: 94 mg/dL (ref 70–99)
Potassium: 3.5 mmol/L (ref 3.5–5.1)
Sodium: 137 mmol/L (ref 135–145)

## 2022-09-14 LAB — CBC
HCT: 37.3 % (ref 36.0–46.0)
Hemoglobin: 12.5 g/dL (ref 12.0–15.0)
MCH: 29.8 pg (ref 26.0–34.0)
MCHC: 33.5 g/dL (ref 30.0–36.0)
MCV: 89 fL (ref 80.0–100.0)
Platelets: 402 10*3/uL — ABNORMAL HIGH (ref 150–400)
RBC: 4.19 MIL/uL (ref 3.87–5.11)
RDW: 12.8 % (ref 11.5–15.5)
WBC: 12.4 10*3/uL — ABNORMAL HIGH (ref 4.0–10.5)
nRBC: 0 % (ref 0.0–0.2)

## 2022-09-14 MED ORDER — KETOROLAC TROMETHAMINE 30 MG/ML IJ SOLN
15.0000 mg | Freq: Once | INTRAMUSCULAR | Status: AC
  Administered 2022-09-14: 15 mg via INTRAVENOUS
  Filled 2022-09-14: qty 1

## 2022-09-14 MED ORDER — HYDROMORPHONE HCL 1 MG/ML IJ SOLN
1.0000 mg | Freq: Once | INTRAMUSCULAR | Status: AC
  Administered 2022-09-14: 1 mg via INTRAVENOUS
  Filled 2022-09-14: qty 1

## 2022-09-14 MED ORDER — OXYCODONE HCL 5 MG PO TABS: 5.0000 mg | ORAL_TABLET | Freq: Three times a day (TID) | ORAL | 0 refills | Status: AC | PRN

## 2022-09-14 NOTE — ED Triage Notes (Signed)
Pt to ED from home for sharp shooting back pains in lower back that shoot down her right leg. Pt was seen at [facility] for same 2 days ago with no treatment. Pt denies any N/V/D. Pt has tried heat pads and soaking in hot tub with no relief. Pt is cAOx4 and in no acute distress in triage. Pt in wheelchair in triage.

## 2022-09-14 NOTE — ED Provider Notes (Signed)
11:47 PM  Assumed care at shift change.  Patient ambulating without difficulty.  Urine does show some possible signs of infection but this does not seem to be back pain from a UTI or pyelonephritis or kidney stone.  This seems more musculoskeletal.  Will add on urine culture but hold antibiotics at this time.  Patient will be discharged with prescription of oxycodone.  At this time, I do not feel there is any life-threatening condition present. I reviewed all nursing notes, vitals, pertinent previous records.  All lab and urine results, EKGs, imaging ordered have been independently reviewed and interpreted by myself.  I reviewed all available radiology reports from any imaging ordered this visit.  Based on my assessment, I feel the patient is safe to be discharged home without further emergent workup and can continue workup as an outpatient as needed. Discussed all findings, treatment plan as well as usual and customary return precautions.  They verbalize understanding and are comfortable with this plan.  Outpatient follow-up has been provided as needed.  All questions have been answered.    Allan Minotti, Delice Bison, DO 09/14/22 2347

## 2022-09-14 NOTE — ED Notes (Signed)
  Patient ambulated in room with standby assist.  Patient tolerated ambulation much better and states the pain was significantly improved.  Dr Leonides Schanz notified.

## 2022-09-14 NOTE — ED Provider Notes (Signed)
[facility] Provider Note    Event Date/Time   First MD Initiated Contact with Patient 09/14/22 2152     (approximate)   History   Back Pain (X2 days)   HPI  Catherine Waters is a 37 y.o. female  with pmh protein C def, asthma, HTN who p/w back pain.  Patient's symptoms started 3 days ago.  Denies any preceding injury.  Pain is located in the right low back.  Denies any clear exacerbating or alleviating factors.  Occasionally radiates down the right posterior thigh.  Denies numbness or weakness.  Has had difficulty ambulating but is still able to ambulate.  Denies bowel bladder incontinence.  Denies fevers or chills.  Denies history of similar pain.  Already taking Tylenol, Motrin and a muscle relaxant for it.     Past Medical History:  Diagnosis Date   Asthma    Hypertension    Protein C deficiency [facility])     Patient Active Problem List   Diagnosis Date Noted   Pelvic pressure in pregnancy, antepartum, third trimester 09/27/2018     Physical Exam  Triage Vital Signs: ED Triage Vitals [09/14/22 2055]  Enc Vitals Group     BP (!) 143/115     Pulse Rate (!) 125     Resp 18     Temp 98.5 F (36.9 C)     Temp Source Oral     SpO2 98 %     Weight 245 lb (111.1 kg)     Height 5' 3"$  (1.6 m)     Head Circumference      Peak Flow      Pain Score 10     Pain Loc      Pain Edu?      Excl. in [place]?     Most recent vital signs: Vitals:   09/14/22 2055  BP: (!) 143/115  Pulse: (!) 125  Resp: 18  Temp: 98.5 F (36.9 C)  SpO2: 98%     General: Awake, patient looks uncomfortable, intermittently having CV:  Good peripheral perfusion.  Resp:  Normal effort.  Abd:  No distention.  Abdomen is soft nontender Neuro:             Awake, Alert, Oriented x 3  Other:  No midline lumbar tenderness 5 out of 5 strength with plantarflexion, dorsiflexion, knee flexion knee extension bilateral lower extremities, sensation grossly intact in bilateral lower  extremities  ED Results / Procedures / Treatments  Labs (all labs ordered are listed, but only abnormal results are displayed) Labs Reviewed  CBC - Abnormal; Notable for the following components:      Result Value   WBC 12.4 (*)    Platelets 402 (*)    All other components within normal limits  BASIC METABOLIC PANEL - Abnormal; Notable for the following components:   Calcium 8.8 (*)    All other components within normal limits  URINALYSIS, ROUTINE W REFLEX MICROSCOPIC  POC URINE PREG, ED     EKG     RADIOLOGY    PROCEDURES:  Critical Care performed: No  Procedures  The patient is on the cardiac monitor to evaluate for evidence of arrhythmia and/or significant heart rate changes.   MEDICATIONS ORDERED IN ED: Medications  ketorolac (TORADOL) 30 MG/ML injection 15 mg (15 mg Intravenous Given 09/14/22 2244)  HYDROmorphone (DILAUDID) injection 1 mg (1 mg Intravenous Given 09/14/22 2245)     IMPRESSION / MDM / ASSESSMENT AND PLAN /  ED COURSE  I reviewed the triage vital signs and the nursing notes.                              Patient's presentation is most consistent with acute complicated illness / injury requiring diagnostic workup.  Differential diagnosis includes, but is not limited to, back spasm, nonspecific low back pain, herniated disc, lumbar radiculopathy, less likely spinal epidural abscess, exam not consistent with cauda equina syndrome  Patient is a 37 year old female relatively healthy presents with atraumatic back pain that started about 3 days ago.  There is no preceding trauma it does radiate down to the posterior thigh but there is no numbness weakness no bowel bladder incontinence.  She still been able to ambulate.  No fevers no history of IV drug use or other risk factors for infection.  On exam she does look quite uncomfortable.  She is tachycardic hypertensive on arrival.  Seems to be having periods where she has spasms.  She has no midline lumbar  tenderness is tender in the lumbar paraspinal region on the right.  She has 5 and 5 strength with plantarflexion dorsiflexion and knee flexion and extension normal sensation.  I have low suspicion for infection or cord compression at this time based on her exam and presentation.  Given the degree of pain we will place IV and give Dilaudid and Toradol.       FINAL CLINICAL IMPRESSION(S) / ED DIAGNOSES   Final diagnoses:  Acute right-sided low back pain with right-sided sciatica     Rx / DC Orders   ED Discharge Orders     None        Note:  This document was prepared using Dragon voice recognition software and may include unintentional dictation errors.   Rada Hay, MD 09/14/22 [phone]

## 2022-09-16 LAB — URINE CULTURE: Culture: NO GROWTH

## 2022-09-17 ENCOUNTER — Telehealth (HOSPITAL_COMMUNITY): Payer: Self-pay | Admitting: Emergency Medicine

## 2022-09-17 MED ORDER — FLUCONAZOLE 150 MG PO TABS: 150.0000 mg | ORAL_TABLET | Freq: Once | ORAL | 0 refills | Status: AC

## 2022-10-06 ENCOUNTER — Emergency Department
Admission: EM | Admit: 2022-10-06 | Discharge: 2022-10-06 | Disposition: A | Discharge status: Home / Self Care | Payer: BC Managed Care – PPO | Attending: Emergency Medicine | Admitting: Emergency Medicine

## 2022-10-06 ENCOUNTER — Other Ambulatory Visit: Payer: Self-pay

## 2022-10-06 DIAGNOSIS — R519 Headache, unspecified: Secondary | ICD-10-CM | POA: Diagnosis present

## 2022-10-06 DIAGNOSIS — G43809 Other migraine, not intractable, without status migrainosus: Secondary | ICD-10-CM | POA: Diagnosis not present

## 2022-10-06 MED ORDER — KETOROLAC TROMETHAMINE 15 MG/ML IJ SOLN
15.0000 mg | Freq: Once | INTRAMUSCULAR | Status: AC
  Administered 2022-10-06: 15 mg via INTRAVENOUS
  Filled 2022-10-06: qty 1

## 2022-10-06 MED ORDER — METOCLOPRAMIDE HCL 5 MG/ML IJ SOLN
10.0000 mg | Freq: Once | INTRAMUSCULAR | Status: AC
  Administered 2022-10-06: 10 mg via INTRAVENOUS
  Filled 2022-10-06: qty 2

## 2022-10-06 MED ORDER — SODIUM CHLORIDE 0.9 % IV BOLUS
1000.0000 mL | Freq: Once | INTRAVENOUS | Status: AC
  Administered 2022-10-06: 1000 mL via INTRAVENOUS

## 2022-10-06 NOTE — Discharge Instructions (Signed)
Please follow-up with your neurologist.  Please return for any new, worsening, or change in symptoms especially if your symptoms are different than your normal headaches, or any other concerns.  It was a pleasure caring for you today.

## 2022-10-06 NOTE — ED Triage Notes (Signed)
Pt reports migraine since las pm. States hx of the same and took her meds but only got a small amount of relief.

## 2022-10-06 NOTE — ED Provider Notes (Signed)
[facility] Provider Note    Event Date/Time   First MD Initiated Contact with Patient 10/06/22 1235     (approximate)   History   Migraine   HPI  Catherine Waters is a 37 y.o. female who presents today for evaluation of headache.  Patient reports that she has a history of migraine headaches for the past 20 years, and her headache today feels exactly the same.  She recently establish care with a neurologist at [facility] last month.  She reports that she thinks that these coincide with her cycle so she is not certain.  She reports that she has had her tubes tied and denies any chance of pregnancy.  She reports that her headache began gradually, no sudden onset to her headache.  She does not recall what she was doing when her headache began.  She denies any visual changes.  Patient Active Problem List   Diagnosis Date Noted   Pelvic pressure in pregnancy, antepartum, third trimester 09/27/2018          Physical Exam   Triage Vital Signs: ED Triage Vitals  Enc Vitals Group     BP 10/06/22 1232 (!) 148/91     Pulse Rate 10/06/22 1232 94     Resp 10/06/22 1232 18     Temp 10/06/22 1232 98.1 F (36.7 C)     Temp src --      SpO2 10/06/22 1232 96 %     Weight 10/06/22 1225 244 lb 11.4 oz (111 kg)     Height 10/06/22 1225 '5\' 3"'$  (1.6 m)     Head Circumference --      Peak Flow --      Pain Score 10/06/22 1225 8     Pain Loc --      Pain Edu? --      Excl. in [place]? --     Most recent vital signs: Vitals:   10/06/22 1232  BP: (!) 148/91  Pulse: 94  Resp: 18  Temp: 98.1 F (36.7 C)  SpO2: 96%    Physical Exam Vitals and nursing note reviewed.  Constitutional:      General: Awake and alert. No acute distress.    Appearance: Normal appearance. The patient is obese.  HENT:     Head: Normocephalic and atraumatic.     Mouth: Mucous membranes are moist.  Eyes:     General: PERRL. Normal EOMs        Right eye: No discharge.        Left eye: No  discharge.     Conjunctiva/sclera: Conjunctivae normal.  Cardiovascular:     Rate and Rhythm: Normal rate and regular rhythm.     Pulses: Normal pulses.  Pulmonary:     Effort: Pulmonary effort is normal. No respiratory distress.     Breath sounds: Normal breath sounds.  Abdominal:     Abdomen is soft. There is no abdominal tenderness. No rebound or guarding. No distention. Musculoskeletal:        General: No swelling. Normal range of motion.     Cervical back: Normal range of motion and neck supple.  Skin:    General: Skin is warm and dry.     Capillary Refill: Capillary refill takes less than 2 seconds.     Findings: No rash.  Neurological:     Mental Status: The patient is awake and alert.   Neurological: GCS 15 alert and oriented x3 Normal speech, no expressive or receptive  aphasia or dysarthria Cranial nerves II through XII intact Normal visual fields 5 out of 5 strength in all 4 extremities with intact sensation throughout No extremity drift Normal finger-to-nose testing, no limb or truncal ataxia    ED Results / Procedures / Treatments   Labs (all labs ordered are listed, but only abnormal results are displayed) Labs Reviewed - No data to display   EKG     RADIOLOGY     PROCEDURES:  Critical Care performed:   Procedures   MEDICATIONS ORDERED IN ED: Medications  ketorolac (TORADOL) 15 MG/ML injection 15 mg (15 mg Intravenous Given 10/06/22 1306)  metoCLOPramide (REGLAN) injection 10 mg (10 mg Intravenous Given 10/06/22 1306)  sodium chloride 0.9 % bolus 1,000 mL (0 mLs Intravenous Stopped 10/06/22 1437)     IMPRESSION / MDM / ASSESSMENT AND PLAN / ED COURSE  I reviewed the triage vital signs and the nursing notes.   Differential diagnosis includes, but is not limited to, migraine headache, tension headache, cluster headache.  I reviewed the patient's chart.  Patient had a CT head on 08/04/2022 which demonstrated no acute intracranial pathology, and  she had a CT venogram on 08/07/2022 which did not reveal evidence of dural venous sinus thrombosis.  Patient was able to see neurology, Dr. Alverda Skeans with [place] [facility] on 09/04/2022 who started her on sumatriptan and also Emgality injections.  He diagnosed her with migraine without aura and without status migrainosus not intractable.  I am unable to see his actual note from his visit.  Patient presented with a chief concern of a headache. Headache is gradual in onset, without history or physical exam findings to suggest encephalopathy; no altered mental status, fever or meningismus, vision changes, vomiting or focal neurological deficit and improved with treatment in the emergency department.  She reports that her headache feels exactly the same as her previous migraine headaches, of which she has had for the past 15 to 20 years.  Therefore, I have low suspicion for concerning process that would require urgent or emergent imaging or diagnostic/therapeutic procedural intervention. Doubt meningitis as there is no fever, photophobia, neck symptoms, altered mental status. Additionally the patient is not known to be immunocompromised. No history of trauma, doubt subdural or epidural hematoma. No dizziness or other neurologic symptoms so cerebellar infarction or other hemorrhagic stroke are unlikely. Intracranial mass unlikely given that the headache is not getting progressively worse, is not worse in the morning, there are no other neurologic symptoms, and the neurologic exam is grossly normal. Unlikely to be giant cell arteritis as there is no tenderness over temporal artery or vision changes. Doubt CO toxicity as no known exposure and no other family members have a headache. No neck pain and was not sudden onset or associated with movement of the neck and no dizziness,  doubt carotid artery dissection. No occipital tenderness so occipital neuralgia seems less likely. Patient has no visual deficits at this time to  suggest pseudotumor.  She had a recent visit with [place] neurology who has confirmed that she has migraine headaches, and she reports that her headache today feels exactly the same as her previous headaches.  Patient was treated with Toradol, Reglan, and IV fluids with complete resolution of her headache.  She had serial neurological exams which remained normal, she is ambulatory with a steady gait.  Return precautions discussed, patient to follow-up closely with outpatient provider.  Patient understands and agrees with plan.  She was discharged with her family member.  We discussed return precautions.   Patient's presentation is most consistent with exacerbation of chronic illness.   Clinical Course as of 10/06/22 1820  Sat Oct 06, 2022  1421 Patient reports that she feels significantly improved and ready for discharge [JP]    Clinical Course User Index [JP] Shaheen Mende, Clarnce Flock, PA-C     FINAL CLINICAL IMPRESSION(S) / ED DIAGNOSES   Final diagnoses:  Other migraine without status migrainosus, not intractable     Rx / DC Orders   ED Discharge Orders     None        Note:  This document was prepared using Dragon voice recognition software and may include unintentional dictation errors.   Emeline Gins 10/06/22 1820    Harvest Dark, MD 10/06/22 1845

## 2022-11-11 ENCOUNTER — Ambulatory Visit
Admission: EM | Admit: 2022-11-11 | Discharge: 2022-11-11 | Disposition: A | Discharge status: Home / Self Care | Payer: BC Managed Care – PPO | Attending: Urgent Care | Admitting: Urgent Care

## 2022-11-11 DIAGNOSIS — Z113 Encounter for screening for infections with a predominantly sexual mode of transmission: Secondary | ICD-10-CM | POA: Diagnosis not present

## 2022-11-11 NOTE — Discharge Instructions (Signed)
Results of today's testing will be available in your MyChart account.  If there is a positive result that requires follow-up, you will be contacted by the on-call nurse.  Follow up here or with your primary care provider if your symptoms are worsening or not improving.

## 2022-11-11 NOTE — ED Provider Notes (Signed)
Catherine Waters    CSN: [id number] Arrival date & time: 11/11/22  4098      History   Chief Complaint Chief Complaint  Patient presents with   SEXUALLY TRANSMITTED DISEASE    HPI Catherine Waters is a 37 y.o. female.   HPI  Presents to urgent care with concern for BV.  Endorses symptoms of vaginal itching x 1 week.  Requesting full STD panel. Recent hx indicates apprx monthly testing.  Past Medical History:  Diagnosis Date   Asthma    Hypertension    Protein C deficiency     Patient Active Problem List   Diagnosis Date Noted   Pelvic pressure in pregnancy, antepartum, third trimester 09/27/2018    Past Surgical History:  Procedure Laterality Date   NO PAST SURGERIES     TUBAL LIGATION      OB History     Gravida  2   Para  1   Term      Preterm  1   AB      Living  1      SAB      IAB      Ectopic      Multiple      Live Births  1            Home Medications    Prior to Admission medications   Medication Sig Start Date End Date Taking? Authorizing Provider  acetaZOLAMIDE (DIAMOX) 250 MG tablet Take 2 tablets (500 mg total) by mouth 2 (two) times daily. 08/07/22 09/06/22  Shaune Pollack, MD  albuterol (VENTOLIN HFA) 108 (90 Base) MCG/ACT inhaler Inhale 1-2 puffs into the lungs every 6 (six) hours as needed for wheezing or shortness of breath. 02/01/22   Mickie Bail, NP  aspirin EC 81 MG tablet Take 81 mg by mouth daily.    [provider]  benzonatate (TESSALON) 100 MG capsule Take 1 capsule (100 mg total) by mouth 3 (three) times daily as needed for cough. Patient not taking: Reported on 09/12/2022 02/01/22   Mickie Bail, NP  butalbital-acetaminophen-caffeine (FIORICET) [phone] MG tablet Take 1-2 tablets by mouth every 6 (six) hours as needed for headache. 08/07/22 08/07/23  Shaune Pollack, MD  famotidine (PEPCID) 20 MG tablet Take by mouth.    [provider]  ibuprofen (ADVIL) 600 MG tablet Take 600 mg by mouth  every 6 (six) hours as needed. 06/19/21   [provider]  levonorgestrel (MIRENA) 20 MCG/DAY IUD 1 each by Intrauterine route once.    [provider]  loratadine (CLARITIN) 10 MG tablet Take 10 mg by mouth daily.    [provider]  melatonin 3 MG TABS tablet Take by mouth. 03/12/18   [provider]  NIFEdipine (ADALAT CC) 60 MG 24 hr tablet Take 60 mg by mouth daily.    [provider]  ondansetron (ZOFRAN ODT) 4 MG disintegrating tablet Take 1 tablet (4 mg total) by mouth every 8 (eight) hours as needed for nausea or vomiting. 09/27/18   Irean Hong, MD  OZEMPIC, 0.25 OR 0.5 MG/DOSE, 2 MG/1.5ML SOPN Inject into the skin. 08/03/21   [provider]  potassium chloride SA (KLOR-CON M) 20 MEQ tablet  09/03/20   [provider]  Prenatal Vit-Fe Fumarate-FA (MULTIVITAMIN-PRENATAL) 27-0.8 MG TABS tablet Take 1 tablet by mouth daily at 12 noon. Patient not taking: Reported on 09/12/2022    [provider]  rizatriptan (MAXALT) 10 MG tablet Take  by mouth. 07/29/22   [provider]  sertraline (ZOLOFT) 50 MG tablet Take 1 tablet by mouth daily. 03/15/20   [provider]  SUMAtriptan (IMITREX) 50 MG tablet SMARTSIG:1 Tablet(s) By Mouth 01/29/22   [provider]  topiramate (TOPAMAX) 50 MG tablet Take by mouth. 08/31/21 03/15/23  [provider]    Family History History reviewed. No pertinent family history.  Social History Social History   Tobacco Use   Smoking status: Never   Smokeless tobacco: Never  Vaping Use   Vaping Use: Never used  Substance Use Topics   Alcohol use: Not Currently   Drug use: Never     Allergies   Minocycline   Review of Systems Review of Systems   Physical Exam Triage Vital Signs ED Triage Vitals  Enc Vitals Group     BP 11/11/22 0833 (!) 143/97     Pulse Rate 11/11/22 0833 82     Resp 11/11/22 0833 16     Temp 11/11/22 0833 98.1 F (36.7 C)      Temp Source 11/11/22 0833 Oral     SpO2 11/11/22 0833 97 %     Weight --      Height --      Head Circumference --      Peak Flow --      Pain Score 11/11/22 0834 0     Pain Loc --      Pain Edu? --      Excl. in [place]? --    No data found.  Updated Vital Signs BP (!) 143/97 (BP Location: Left Arm)   Pulse 82   Temp 98.1 F (36.7 C) (Oral)   Resp 16   SpO2 97%   Visual Acuity Right Eye Distance:   Left Eye Distance:   Bilateral Distance:    Right Eye Near:   Left Eye Near:    Bilateral Near:     Physical Exam Vitals reviewed.  Constitutional:      Appearance: Normal appearance.  Skin:    General: Skin is warm and dry.  Neurological:     General: No focal deficit present.     Mental Status: She is alert and oriented to person, place, and time.  Psychiatric:        Mood and Affect: Mood normal.        Behavior: Behavior normal.      UC Treatments / Results  Labs (all labs ordered are listed, but only abnormal results are displayed) Labs Reviewed - No data to display  EKG   Radiology No results found.  Procedures Procedures (including critical care time)  Medications Ordered in UC Medications - No data to display  Initial Impression / Assessment and Plan / UC Course  I have reviewed the triage vital signs and the nursing notes.  Pertinent labs & imaging results that were available during my care of the patient were reviewed by me and considered in my medical decision making (see chart for details).   Vag swab and blood samples taken. Results pending.   Final Clinical Impressions(s) / UC Diagnoses   Final diagnoses:  Screen for STD (sexually transmitted disease)   Discharge Instructions   None    ED Prescriptions   None    PDMP not reviewed this encounter.   Charma Igo, [place] 11/11/22 [phone]

## 2022-11-11 NOTE — ED Triage Notes (Signed)
Patient presents to UC for BV. Reports vaginal itching. Hx of BV. Requesting full STD screening.

## 2022-11-13 LAB — CERVICOVAGINAL ANCILLARY ONLY
Bacterial Vaginitis (gardnerella): POSITIVE — AB
Candida Glabrata: NEGATIVE
Candida Vaginitis: NEGATIVE
Chlamydia: NEGATIVE
Comment: NEGATIVE
Comment: NEGATIVE
Comment: NEGATIVE
Comment: NEGATIVE
Comment: NEGATIVE
Comment: NORMAL
Neisseria Gonorrhea: NEGATIVE
Trichomonas: NEGATIVE

## 2022-11-13 LAB — RPR: RPR Ser Ql: NONREACTIVE

## 2022-11-13 LAB — HIV ANTIBODY (ROUTINE TESTING W REFLEX): HIV Screen 4th Generation wRfx: NONREACTIVE

## 2022-11-14 ENCOUNTER — Telehealth (HOSPITAL_COMMUNITY): Payer: Self-pay | Admitting: Emergency Medicine

## 2022-11-14 MED ORDER — METRONIDAZOLE 500 MG PO TABS: 500.0000 mg | ORAL_TABLET | Freq: Two times a day (BID) | ORAL | 0 refills | Status: AC

## 2023-03-07 ENCOUNTER — Ambulatory Visit
Admission: RE | Admit: 2023-03-07 | Discharge: 2023-03-07 | Disposition: A | Discharge status: Home / Self Care | Payer: BC Managed Care – PPO | Source: Ambulatory Visit | Attending: Emergency Medicine | Admitting: Emergency Medicine

## 2023-03-07 VITALS — BP 136/96 | HR 89 | Temp 98.9°F | Resp 18

## 2023-03-07 DIAGNOSIS — M545 Low back pain, unspecified: Secondary | ICD-10-CM

## 2023-03-07 MED ORDER — PREDNISONE 20 MG PO TABS: 40.0000 mg | ORAL_TABLET | Freq: Every day | ORAL | 0 refills | Status: DC

## 2023-03-07 MED ORDER — CYCLOBENZAPRINE HCL 10 MG PO TABS: 10.0000 mg | ORAL_TABLET | Freq: Two times a day (BID) | ORAL | 0 refills | Status: DC | PRN

## 2023-03-07 MED ORDER — KETOROLAC TROMETHAMINE 30 MG/ML IJ SOLN
30.0000 mg | Freq: Once | INTRAMUSCULAR | Status: AC
  Administered 2023-03-07: 30 mg via INTRAMUSCULAR

## 2023-03-07 NOTE — Discharge Instructions (Addendum)
Your pain is most likely caused by irritation to the muscles.  Given injection of Toradol today in the office to help reduce inflammation and help with pain, daily will start to see relief in about 30 minutes to an hour  Starting tomorrow to prednisone daily with food for 5 days to reduce inflammation which in turn will help with pain, may take Tylenol or use this medicine  You may use muscle relaxant as needed twice daily for additional comfort, be mindful this will make you feel drowsy  You may use heating pad in 15 minute intervals as needed for additional comfort, or you may find comfort in using ice in 10-15 minutes over affected area  Begin stretching affected area daily for 10 minutes as tolerated to further loosen muscles   When lying down place pillow underneath and between knees for support  Practice good posture: head back, shoulders back, chest forward, pelvis back and weight distributed evenly on both legs  If pain persist after recommended treatment or reoccurs if may be beneficial to follow up with orthopedic specialist for evaluation, this doctor specializes in the bones and can manage your symptoms long-term with options such as but not limited to imaging, medications or physical therapy, may go to any of the following offices listed on the front page, whoever is able to see you and takes your insurance
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
## 2023-03-20 ENCOUNTER — Other Ambulatory Visit: Payer: Self-pay | Admitting: Physician Assistant

## 2023-03-20 DIAGNOSIS — S32000A Wedge compression fracture of unspecified lumbar vertebra, initial encounter for closed fracture: Secondary | ICD-10-CM

## 2023-03-26 ENCOUNTER — Inpatient Hospital Stay: Admission: RE | Admit: 2023-03-26 | Payer: BC Managed Care – PPO | Source: Ambulatory Visit

## 2023-06-06 ENCOUNTER — Ambulatory Visit
Admission: RE | Admit: 2023-06-06 | Discharge: 2023-06-06 | Disposition: A | Discharge status: Home / Self Care | Payer: Medicaid Other | Source: Ambulatory Visit | Attending: Physician Assistant | Admitting: Physician Assistant

## 2023-06-06 DIAGNOSIS — S32000A Wedge compression fracture of unspecified lumbar vertebra, initial encounter for closed fracture: Secondary | ICD-10-CM

## 2023-10-12 ENCOUNTER — Other Ambulatory Visit: Payer: Self-pay

## 2023-10-12 ENCOUNTER — Ambulatory Visit
Admission: EM | Admit: 2023-10-12 | Discharge: 2023-10-12 | Disposition: A | Discharge status: Home / Self Care | Attending: Emergency Medicine | Admitting: Emergency Medicine

## 2023-10-12 ENCOUNTER — Encounter: Payer: Self-pay | Admitting: Emergency Medicine

## 2023-10-12 DIAGNOSIS — G8929 Other chronic pain: Secondary | ICD-10-CM | POA: Diagnosis present

## 2023-10-12 DIAGNOSIS — M545 Low back pain, unspecified: Secondary | ICD-10-CM | POA: Diagnosis present

## 2023-10-12 DIAGNOSIS — H1033 Unspecified acute conjunctivitis, bilateral: Secondary | ICD-10-CM

## 2023-10-12 DIAGNOSIS — N898 Other specified noninflammatory disorders of vagina: Secondary | ICD-10-CM

## 2023-10-12 DIAGNOSIS — N3000 Acute cystitis without hematuria: Secondary | ICD-10-CM | POA: Diagnosis present

## 2023-10-12 LAB — POCT URINALYSIS DIP (MANUAL ENTRY)
Glucose, UA: NEGATIVE mg/dL
Nitrite, UA: POSITIVE — AB
Protein Ur, POC: >=300 mg/dL — AB
Spec Grav, UA: 1.025
Urobilinogen, UA: 2 U/dL — AB
pH, UA: 6

## 2023-10-12 MED ORDER — CYCLOBENZAPRINE HCL 10 MG PO TABS: 10.0000 mg | ORAL_TABLET | Freq: Every day | ORAL | 0 refills | Status: AC

## 2023-10-12 MED ORDER — NITROFURANTOIN MONOHYD MACRO 100 MG PO CAPS: 100.0000 mg | ORAL_CAPSULE | Freq: Two times a day (BID) | ORAL | 0 refills | Status: AC

## 2023-10-12 MED ORDER — MOXIFLOXACIN HCL 0.5 % OP SOLN: 1.0000 [drp] | Freq: Three times a day (TID) | OPHTHALMIC | 0 refills | Status: AC

## 2023-10-12 MED ORDER — PREDNISONE 20 MG PO TABS: 40.0000 mg | ORAL_TABLET | Freq: Every day | ORAL | 0 refills | Status: DC

## 2023-10-12 NOTE — Discharge Instructions (Addendum)
 For your bladder   We did not let nobody corrected you-Your urinalysis shows [place] blood cells and nitrates which are indicative of infection, your urine will be sent to the lab to determine exactly which bacteria is present, if any changes need to be made to your medications you will be notified -Begin use of bed every morning and every evening for 5 days -You may use over-the-counter Azo to help minimize your symptoms until antibiotic removes bacteria, this medication will turn your urine orange -Increase your fluid intake through use of water -As always practice good hygiene, wiping front to back and avoidance of scented vaginal products to prevent further irritation -If symptoms continue to persist after use of medication or recur please follow-up with urgent care or your primary doctor as needed -Labs pending 2-3 days, you will be contacted if positive for any sti and treatment will be sent to the pharmacy, you will have to return to the clinic if positive for gonorrhea to receive treatment  -Please refrain from having sex until labs results, if positive please refrain from having sex until treatment complete and symptoms resolve  -If positive for HIV, Syphilis, Chlamydia  gonorrhea or trichomoniasis please notify partner or partners so they may tested as well -Moving forward, it is recommended you use some form of protection against the transmission of sti infections  such as condoms or dental dams with each sexual encounter    For your back -Your pain is most likely caused by irritation to the spine -Begin prednisone every morning with food for 5 days, may take Tylenol while using this medicine or any topical medicines - May use muscle relaxant at bedtime as needed for additional comfort -You may use heating pad in 15 minute intervals as needed for additional comfort, or you may find comfort in using ice in 10-15 minutes over affected area -Begin stretching affected area daily for 10 minutes  as tolerated to further loosen muscles  -When lying down place pillow underneath and between knees for support -Can try sleeping without pillow on firm mattress  -Practice good posture: head back, shoulders back, chest forward, pelvis back and weight distributed evenly on both legs -If pain persist after recommended treatment or reoccurs if may be beneficial to follow up with orthopedic specialist for evaluation, this doctor specializes in the bones and can manage your symptoms long-term with options such as but not limited to imaging, medications or physical therapy   For your eyes   -Place one drop of moxifloxacin into the effected eye every 8 hours while awake for 7 days. If the other eye starts to have symptoms you may use medication in it as well. Do not allow tip of dropper to touch eye. -May use cool compress for comfort and to remove discharge if present. Pat the eye, do not wipe. -If wearing contacts, dispose of current pair. Wear glasses until symptoms have resolved.  -Do not rub eyes, this may cause more irritation. -May use benadryl as needed to help if itching present. -Please avoid use of eye makeup until symptoms clear. -If symptoms persist after use of medication, please follow up at Urgent Care or with ophthalmologist (eye doctor)

## 2023-10-12 NOTE — ED Provider Notes (Signed)
 Catherine Waters    CSN: [id number] Arrival date & time: 10/12/23  1231      History   Chief Complaint Chief Complaint  Patient presents with   Eye Pain    HPI Catherine Waters is a 38 y.o. female.   Patient presents for evaluation of midline lower back pain present for 2 weeks.  Symptoms occurring intermittently exacerbated by movement.  Denies numbness or tingling, urinary or bowel incontinence.  Endorses injury to the back in August 2024.  Has been experiencing urinary frequency and vaginal odor for 1 to 2 weeks.  Has not attempted treatment.  Sexually active, no known exposure.  Denies dysuria, hematuria, abdominal pain, fever, vaginal itching or discharge.  Patient concerns with erythema, pruritus and discomfort present to the bilateral eyes for 3 days.  Has not attempted treatment.  Wears contacts intermittently but has not used recently.  Denies injury or trauma or visual disturbance.  Past Medical History:  Diagnosis Date   Asthma    Hypertension    Protein C deficiency [facility])     Patient Active Problem List   Diagnosis Date Noted   Pelvic pressure in pregnancy, antepartum, third trimester 09/27/2018   Encounter for procreative genetic counseling 05/21/2018   Asthma affecting pregnancy in first trimester 05/13/2018   Chronic hypertension affecting pregnancy 05/13/2018   H/O chlamydia infection 05/13/2018   H/O suicide attempt 05/13/2018   Hx of preeclampsia, prior pregnancy, currently pregnant, first trimester 05/13/2018   Hypothyroid in pregnancy, antepartum, first trimester 05/13/2018   Rh negative state in antepartum period, first trimester 05/13/2018   Protein C deficiency affecting pregnancy (HCC) 05/13/2018   Personal history of other diseases of the female genital tract 05/13/2018   Supervision of other high risk pregnancies, first trimester 05/13/2018   Rh negative status during pregnancy 04/04/2018   History of depression 03/27/2018   Hypokalemia  03/14/2018   Microscopic hematuria 10/08/2017   Obstructive sleep apnea (adult) (pediatric) 12/17/2016   Migraine without aura and without status migrainosus, not intractable 12/11/2016   BMI 40.0-44.9, adult (HCC) 02/16/2016   Essential (primary) hypertension 08/02/2014   Mild intermittent asthma 06/10/2013   Thrombophilia (HCC) 07/31/2012    Past Surgical History:  Procedure Laterality Date   NO PAST SURGERIES     TUBAL LIGATION      OB History     Gravida  2   Para  1   Term      Preterm  1   AB      Living  1      SAB      IAB      Ectopic      Multiple      Live Births  1            Home Medications    Prior to Admission medications   Medication Sig Start Date End Date Taking? Authorizing Provider  cyclobenzaprine (FLEXERIL) 10 MG tablet Take 1 tablet (10 mg total) by mouth at bedtime. 10/12/23  Yes Makaela Cando R, NP  moxifloxacin (VIGAMOX) 0.5 % ophthalmic solution Place 1 drop into both eyes 3 (three) times daily. 10/12/23  Yes Julius Matus R, NP  nitrofurantoin, macrocrystal-monohydrate, (MACROBID) 100 MG capsule Take 1 capsule (100 mg total) by mouth 2 (two) times daily. 10/12/23  Yes Anaisabel Pederson R, NP  predniSONE (DELTASONE) 20 MG tablet Take 2 tablets (40 mg total) by mouth daily. 10/12/23  Yes Notnamed Scholz R, NP  acetaZOLAMIDE (DIAMOX) 250 MG tablet  Take 2 tablets (500 mg total) by mouth 2 (two) times daily. 08/07/22 09/06/22  Shaune Pollack, MD  albuterol (VENTOLIN HFA) 108 (90 Base) MCG/ACT inhaler Inhale 1-2 puffs into the lungs every 6 (six) hours as needed for wheezing or shortness of breath. 02/01/22   Mickie Bail, NP  aspirin EC 81 MG tablet Take 81 mg by mouth daily.    [provider]  benzonatate (TESSALON) 100 MG capsule Take 1 capsule (100 mg total) by mouth 3 (three) times daily as needed for cough. Patient not taking: Reported on 09/12/2022 02/01/22   Mickie Bail, NP  famotidine (PEPCID) 20 MG tablet Take by mouth.     [provider]  ibuprofen (ADVIL) 600 MG tablet Take 600 mg by mouth every 6 (six) hours as needed. 06/19/21   [provider]  levonorgestrel (MIRENA) 20 MCG/DAY IUD 1 each by Intrauterine route once.    [provider]  loratadine (CLARITIN) 10 MG tablet Take 10 mg by mouth daily.    [provider]  melatonin 3 MG TABS tablet Take by mouth. 03/12/18   [provider]  metroNIDAZOLE (FLAGYL) 500 MG tablet Take 1 tablet (500 mg total) by mouth 2 (two) times daily. 11/14/22   Merrilee Jansky, MD  NIFEdipine (ADALAT CC) 60 MG 24 hr tablet Take 60 mg by mouth daily.    [provider]  ondansetron (ZOFRAN ODT) 4 MG disintegrating tablet Take 1 tablet (4 mg total) by mouth every 8 (eight) hours as needed for nausea or vomiting. 09/27/18   Irean Hong, MD  OZEMPIC, 0.25 OR 0.5 MG/DOSE, 2 MG/1.5ML SOPN Inject into the skin. 08/03/21   [provider]  potassium chloride SA (KLOR-CON M) 20 MEQ tablet  09/03/20   [provider]  Prenatal Vit-Fe Fumarate-FA (MULTIVITAMIN-PRENATAL) 27-0.8 MG TABS tablet Take 1 tablet by mouth daily at 12 noon. Patient not taking: Reported on 09/12/2022    [provider]  rizatriptan (MAXALT) 10 MG tablet Take by mouth. 07/29/22   [provider]  sertraline (ZOLOFT) 50 MG tablet Take 1 tablet by mouth daily. 03/15/20   [provider]  SUMAtriptan (IMITREX) 50 MG tablet SMARTSIG:1 Tablet(s) By Mouth 01/29/22   [provider]    Family History History reviewed. No pertinent family history.  Social History Social History   Tobacco Use   Smoking status: Never   Smokeless tobacco: Never  Vaping Use   Vaping status: Never Used  Substance Use Topics   Alcohol use: Not Currently   Drug use: Never     Allergies   Minocycline   Review of Systems Review of Systems   Physical Exam Triage Vital Signs ED Triage Vitals [10/12/23 1302]  Encounter Vitals  Group     BP (!) 134/91     Systolic BP Percentile      Diastolic BP Percentile      Pulse Rate 96     Resp 18     Temp 98.3 F (36.8 C)     Temp Source Oral     SpO2 98 %     Weight      Height      Head Circumference      Peak Flow      Pain Score 10     Pain Loc      Pain Education      Exclude from Growth Chart    No data found.  Updated Vital Signs BP Marland Kitchen)  134/91 (BP Location: Left Arm)   Pulse 96   Temp 98.3 F (36.8 C) (Oral)   Resp 18   SpO2 98%   Visual Acuity Right Eye Distance:   Left Eye Distance:   Bilateral Distance:    Right Eye Near:   Left Eye Near:    Bilateral Near:     Physical Exam Constitutional:      Appearance: Normal appearance.  Eyes:     Comments: Erythema present to the bilateral conjunctiva, no drainage on exam, vision grossly intact, extraocular movements intact  Pulmonary:     Effort: Pulmonary effort is normal.     Breath sounds: Normal breath sounds.  Abdominal:     Tenderness: There is no abdominal tenderness. There is no right CVA tenderness, left CVA tenderness or guarding.  Genitourinary:    Comments: deferred Musculoskeletal:     Comments: Tenderness present to the midline of the lumbar region, no ecchymosis swelling or deformity, no spinal tenderness noted, able to sit erect without complication, pain elicited with range of motion  Neurological:     Mental Status: She is alert.      UC Treatments / Results  Labs (all labs ordered are listed, but only abnormal results are displayed) Labs Reviewed  POCT URINALYSIS DIP (MANUAL ENTRY) - Abnormal; Notable for the following components:      Result Value   Color, UA red (*)    Clarity, UA turbid (*)    Bilirubin, UA moderate (*)    Ketones, POC UA trace (5) (*)    Blood, UA large (*)    Protein Ur, POC >=300 (*)    Urobilinogen, UA 2.0 (*)    Nitrite, UA Positive (*)    Leukocytes, UA Large (3+) (*)    All other components within normal limits  URINE CULTURE   RPR  HIV ANTIBODY (ROUTINE TESTING W REFLEX)  CERVICOVAGINAL ANCILLARY ONLY    EKG   Radiology No results found.  Procedures Procedures (including critical care time)  Medications Ordered in UC Medications - No data to display  Initial Impression / Assessment and Plan / UC Course  I have reviewed the triage vital signs and the nursing notes.  Pertinent labs & imaging results that were available during my care of the patient were reviewed by me and considered in my medical decision making (see chart for details).  Acute cystitis without hematuria, vaginal odor, chronic midline low back pain without sciatica, acute bacterial conjunctivitis of both eyes  Urinalysis showing leukocytes and nitrates, sent for culture, prescribed Macrobid and recommended additional supportive measures with follow-up if symptoms persist or worsen  STI labs pending will treat per protocol, advised abstinence until lab results, and/or treatment is complete, advised condom use during all sexual encounters moving, may follow-up with urgent care as needed   Etiology of back pain is most likely related to fracture occurring in August 2024, discussed this with patient, no knee injury therefore deferring imaging, prescribed prednisone and Flexeril for home use recommended additional supportive measures with follow-up with orthopedics if symptoms persist or worsen  Presentation of the eyes consistent with a conjunctivitis, prescribed moxifloxacin and discussed administration, advised against use of contacts and recommended supportive care for comfort with follow-up with urgent care as needed Final Clinical Impressions(s) / UC Diagnoses   Final diagnoses:  Acute cystitis without hematuria  Vaginal odor  Chronic midline low back pain without sciatica  Acute bacterial conjunctivitis of both eyes     Discharge Instructions  For your bladder   We did not let nobody corrected you-Your urinalysis shows  Elvi Leventhal blood cells and nitrates which are indicative of infection, your urine will be sent to the lab to determine exactly which bacteria is present, if any changes need to be made to your medications you will be notified -Begin use of bed every morning and every evening for 5 days -You may use over-the-counter Azo to help minimize your symptoms until antibiotic removes bacteria, this medication will turn your urine orange -Increase your fluid intake through use of water -As always practice good hygiene, wiping front to back and avoidance of scented vaginal products to prevent further irritation -If symptoms continue to persist after use of medication or recur please follow-up with urgent care or your primary doctor as needed -Labs pending 2-3 days, you will be contacted if positive for any sti and treatment will be sent to the pharmacy, you will have to return to the clinic if positive for gonorrhea to receive treatment  -Please refrain from having sex until labs results, if positive please refrain from having sex until treatment complete and symptoms resolve  -If positive for HIV, Syphilis, Chlamydia  gonorrhea or trichomoniasis please notify partner or partners so they may tested as well -Moving forward, it is recommended you use some form of protection against the transmission of sti infections  such as condoms or dental dams with each sexual encounter    For your back -Your pain is most likely caused by irritation to the spine -Begin prednisone every morning with food for 5 days, may take Tylenol while using this medicine or any topical medicines - May use muscle relaxant at bedtime as needed for additional comfort -You may use heating pad in 15 minute intervals as needed for additional comfort, or you may find comfort in using ice in 10-15 minutes over affected area -Begin stretching affected area daily for 10 minutes as tolerated to further loosen muscles  -When lying down place pillow  underneath and between knees for support -Can try sleeping without pillow on firm mattress  -Practice good posture: head back, shoulders back, chest forward, pelvis back and weight distributed evenly on both legs -If pain persist after recommended treatment or reoccurs if may be beneficial to follow up with orthopedic specialist for evaluation, this doctor specializes in the bones and can manage your symptoms long-term with options such as but not limited to imaging, medications or physical therapy   For your eyes   -Place one drop of moxifloxacin into the effected eye every 8 hours while awake for 7 days. If the other eye starts to have symptoms you may use medication in it as well. Do not allow tip of dropper to touch eye. -May use cool compress for comfort and to remove discharge if present. Pat the eye, do not wipe. -If wearing contacts, dispose of current pair. Wear glasses until symptoms have resolved.  -Do not rub eyes, this may cause more irritation. -May use benadryl as needed to help if itching present. -Please avoid use of eye makeup until symptoms clear. -If symptoms persist after use of medication, please follow up at Urgent Care or with ophthalmologist (eye doctor)       ED Prescriptions     Medication Sig Dispense Auth. Provider   moxifloxacin (VIGAMOX) 0.5 % ophthalmic solution Place 1 drop into both eyes 3 (three) times daily. 3 mL Adriyana Greenbaum R, NP   predniSONE (DELTASONE) 20 MG tablet Take 2 tablets (40 mg  total) by mouth daily. 10 tablet Donielle Radziewicz, Hansel Starling R, NP   cyclobenzaprine (FLEXERIL) 10 MG tablet Take 1 tablet (10 mg total) by mouth at bedtime. 10 tablet Barnett Elzey R, NP   nitrofurantoin, macrocrystal-monohydrate, (MACROBID) 100 MG capsule Take 1 capsule (100 mg total) by mouth 2 (two) times daily. 10 capsule Valinda Hoar, NP      PDMP not reviewed this encounter.   Valinda Hoar, NP 10/12/23 [phone]

## 2023-10-12 NOTE — ED Triage Notes (Signed)
 Patient presents to [facility] for evaluation of bilateral eye pain starting yesterday with redness and discharge  Patient also c/o back pain x 2 weeks and would like to be checked for a UTI.  She also requests STI testing.  She is currently on her cycle.

## 2023-10-13 LAB — HIV ANTIBODY (ROUTINE TESTING W REFLEX): HIV Screen 4th Generation wRfx: NONREACTIVE

## 2023-10-13 LAB — URINE CULTURE

## 2023-10-13 LAB — RPR: RPR Ser Ql: NONREACTIVE

## 2023-10-14 LAB — CERVICOVAGINAL ANCILLARY ONLY
Bacterial Vaginitis (gardnerella): POSITIVE — AB
Candida Glabrata: NEGATIVE
Candida Vaginitis: NEGATIVE
Chlamydia: NEGATIVE
Comment: NEGATIVE
Comment: NEGATIVE
Comment: NEGATIVE
Comment: NEGATIVE
Comment: NEGATIVE
Comment: NORMAL
Neisseria Gonorrhea: NEGATIVE
Trichomonas: NEGATIVE

## 2023-10-15 ENCOUNTER — Telehealth (HOSPITAL_COMMUNITY): Payer: Self-pay

## 2023-10-15 MED ORDER — FLUCONAZOLE 150 MG PO TABS: 150.0000 mg | ORAL_TABLET | Freq: Once | ORAL | 0 refills | Status: AC

## 2023-10-15 MED ORDER — METRONIDAZOLE 0.75 % VA GEL: 1.0000 | Freq: Every day | VAGINAL | 0 refills | Status: AC

## 2023-10-15 NOTE — Telephone Encounter (Signed)
 Per protocol, pt requires tx with metronidazole. Pt requested diflucan for abx-associated yeast. Protocol for same. Reviewed with patient, verified pharmacy, prescription sent.

## 2023-11-01 ENCOUNTER — Ambulatory Visit
Admission: EM | Admit: 2023-11-01 | Discharge: 2023-11-01 | Disposition: A | Discharge status: Home / Self Care | Attending: Emergency Medicine | Admitting: Emergency Medicine

## 2023-11-01 DIAGNOSIS — L292 Pruritus vulvae: Secondary | ICD-10-CM | POA: Insufficient documentation

## 2023-11-01 DIAGNOSIS — N898 Other specified noninflammatory disorders of vagina: Secondary | ICD-10-CM | POA: Diagnosis not present

## 2023-11-01 DIAGNOSIS — Z113 Encounter for screening for infections with a predominantly sexual mode of transmission: Secondary | ICD-10-CM | POA: Insufficient documentation

## 2023-11-01 LAB — POCT URINALYSIS DIP (MANUAL ENTRY)
Bilirubin, UA: NEGATIVE
Glucose, UA: NEGATIVE mg/dL
Ketones, POC UA: NEGATIVE mg/dL
Leukocytes, UA: NEGATIVE
Nitrite, UA: NEGATIVE
Protein Ur, POC: NEGATIVE mg/dL
Spec Grav, UA: 1.015 (ref 1.010–1.025)
Urobilinogen, UA: 0.2 U/dL
pH, UA: 6 (ref 5.0–8.0)

## 2023-11-01 NOTE — ED Triage Notes (Signed)
 Patient to Urgent Care with complaints of vaginal itching/ irritation and burning.  Reports symptoms started approx 3 weeks ago.   Treated for BV and UTI  but still having burning and dysuria. Concerned about a possible STD.

## 2023-11-01 NOTE — Discharge Instructions (Addendum)
 Your vaginal tests are pending.  If your test results are positive, we will call you.  You and your sexual partner(s) may require treatment at that time.  Do not have sexual activity for at least 7 days.    Follow up with your primary care provider or gynecologst.

## 2023-11-01 NOTE — ED Provider Notes (Signed)
 [name]    CSN: [id number] Arrival date & time: 11/01/23  1135      History   Chief Complaint Chief Complaint  Patient presents with   Vaginal Itching    HPI Catherine Waters is a 38 y.o. female.  Patient presents with malodorous vaginal discharge and vaginal itching x 3 weeks.  She is concerned for recurrence of bacterial vaginitis but also possible STD.  She denies fever, rash, abdominal pain, dysuria, hematuria, pelvic pain.  Patient was seen at this urgent care on 10/12/2023; diagnosed with acute cystitis, vaginal odor, chronic low back pain, bacterial conjunctivitis; treated with Macrobid, Flexeril, moxifloxacin eyedrops.  Vaginal cytology positive for bacterial vaginitis; with metronidazole and fluconazole on 10/15/2023.  The history is provided by the patient and medical records.    Past Medical History:  Diagnosis Date   Asthma    Hypertension    Protein C deficiency [facility])     Patient Active Problem List   Diagnosis Date Noted   Pelvic pressure in pregnancy, antepartum, third trimester 09/27/2018   Encounter for procreative genetic counseling 05/21/2018   Asthma affecting pregnancy in first trimester 05/13/2018   Chronic hypertension affecting pregnancy 05/13/2018   H/O chlamydia infection 05/13/2018   H/O suicide attempt 05/13/2018   Hx of preeclampsia, prior pregnancy, currently pregnant, first trimester 05/13/2018   Hypothyroid in pregnancy, antepartum, first trimester 05/13/2018   Rh negative state in antepartum period, first trimester 05/13/2018   Protein C deficiency affecting pregnancy (HCC) 05/13/2018   Personal history of other diseases of the female genital tract 05/13/2018   Supervision of other high risk pregnancies, first trimester 05/13/2018   Rh negative status during pregnancy 04/04/2018   History of depression 03/27/2018   Hypokalemia 03/14/2018   Microscopic hematuria 10/08/2017   Obstructive sleep apnea (adult) (pediatric) 12/17/2016    Migraine without aura and without status migrainosus, not intractable 12/11/2016   BMI 40.0-44.9, adult (HCC) 02/16/2016   Essential (primary) hypertension 08/02/2014   Mild intermittent asthma 06/10/2013   Thrombophilia (HCC) 07/31/2012    Past Surgical History:  Procedure Laterality Date   NO PAST SURGERIES     TUBAL LIGATION      OB History     Gravida  2   Para  1   Term      Preterm  1   AB      Living  1      SAB      IAB      Ectopic      Multiple      Live Births  1            Home Medications    Prior to Admission medications   Medication Sig Start Date End Date Taking? Authorizing Provider  amLODipine-olmesartan (AZOR) 5-20 MG tablet Take 1 tablet by mouth daily. 03/15/23 03/14/24 Yes [provider]  EMGALITY 120 MG/ML SOAJ Inject into the skin. 09/09/23  Yes [provider]  ferrous sulfate 325 (65 FE) MG EC tablet Take 1 tablet by mouth every other day. 07/31/23  Yes [provider]  UBRELVY 100 MG TABS Take by mouth. 10/18/23  Yes [provider]  Vitamin D, Ergocalciferol, (DRISDOL) 1.25 MG (50000 UNIT) CAPS capsule Take 50,000 Units by mouth once a week. 08/26/23  Yes [provider]  acetaZOLAMIDE (DIAMOX) 250 MG tablet Take 2 tablets (500 mg total) by mouth 2 (two) times daily. Patient not taking: Reported on 11/01/2023 08/07/22 09/06/22  Shaune Pollack,  MD  albuterol (VENTOLIN HFA) 108 (90 Base) MCG/ACT inhaler Inhale 1-2 puffs into the lungs every 6 (six) hours as needed for wheezing or shortness of breath. Patient not taking: Reported on 11/01/2023 02/01/22   Mickie Bail, NP  aspirin EC 81 MG tablet Take 81 mg by mouth daily. Patient not taking: Reported on 11/01/2023    [provider]  benzonatate (TESSALON) 100 MG capsule Take 1 capsule (100 mg total) by mouth 3 (three) times daily as needed for cough. Patient not taking: Reported on 09/12/2022 02/01/22   Mickie Bail, NP  buPROPion  (WELLBUTRIN XL) 300 MG 24 hr tablet Take by mouth.    [provider]  cyclobenzaprine (FLEXERIL) 10 MG tablet Take 1 tablet (10 mg total) by mouth at bedtime. Patient not taking: Reported on 11/01/2023 10/12/23   Valinda Hoar, NP  famotidine (PEPCID) 20 MG tablet Take by mouth. Patient not taking: Reported on 11/01/2023    [provider]  ibuprofen (ADVIL) 600 MG tablet Take 600 mg by mouth every 6 (six) hours as needed. Patient not taking: Reported on 11/01/2023 06/19/21   [provider]  levonorgestrel (MIRENA) 20 MCG/DAY IUD 1 each by Intrauterine route once. Patient not taking: Reported on 11/01/2023    [provider]  loratadine (CLARITIN) 10 MG tablet Take 10 mg by mouth daily. Patient not taking: Reported on 11/01/2023    [provider]  melatonin 3 MG TABS tablet Take by mouth. 03/12/18   [provider]  metroNIDAZOLE (FLAGYL) 500 MG tablet Take 1 tablet (500 mg total) by mouth 2 (two) times daily. Patient not taking: Reported on 11/01/2023 11/14/22   Merrilee Jansky, MD  moxifloxacin (VIGAMOX) 0.5 % ophthalmic solution Place 1 drop into both eyes 3 (three) times daily. Patient not taking: Reported on 11/01/2023 10/12/23   Valinda Hoar, NP  NIFEdipine (ADALAT CC) 60 MG 24 hr tablet Take 60 mg by mouth daily. Patient not taking: Reported on 11/01/2023    [provider]  nitrofurantoin, macrocrystal-monohydrate, (MACROBID) 100 MG capsule Take 1 capsule (100 mg total) by mouth 2 (two) times daily. Patient not taking: Reported on 11/01/2023 10/12/23   Valinda Hoar, NP  ondansetron (ZOFRAN ODT) 4 MG disintegrating tablet Take 1 tablet (4 mg total) by mouth every 8 (eight) hours as needed for nausea or vomiting. Patient not taking: Reported on 11/01/2023 09/27/18   Irean Hong, MD  OZEMPIC, 0.25 OR 0.5 MG/DOSE, 2 MG/1.5ML SOPN Inject into the skin. Patient not taking: Reported on 11/01/2023 08/03/21   [provider]   potassium chloride SA (KLOR-CON M) 20 MEQ tablet  09/03/20   [provider]  predniSONE (DELTASONE) 20 MG tablet Take 2 tablets (40 mg total) by mouth daily. Patient not taking: Reported on 11/01/2023 10/12/23   Valinda Hoar, NP  Prenatal Vit-Fe Fumarate-FA (MULTIVITAMIN-PRENATAL) 27-0.8 MG TABS tablet Take 1 tablet by mouth daily at 12 noon. Patient not taking: Reported on 09/12/2022    [provider]  rizatriptan (MAXALT) 10 MG tablet Take by mouth. Patient not taking: Reported on 11/01/2023 07/29/22   [provider]  sertraline (ZOLOFT) 50 MG tablet Take 1 tablet by mouth daily. 03/15/20   [provider]  SUMAtriptan (IMITREX) 50 MG tablet SMARTSIG:1 Tablet(s) By Mouth Patient not taking: Reported on 11/01/2023 01/29/22   [provider]    Family History History reviewed. No pertinent family history.  Social History Social History   Tobacco  Use   Smoking status: Never   Smokeless tobacco: Never  Vaping Use   Vaping status: Never Used  Substance Use Topics   Alcohol use: Not Currently   Drug use: Never     Allergies   Minocycline   Review of Systems Review of Systems  Constitutional:  Negative for chills and fever.  Gastrointestinal:  Negative for abdominal pain.  Genitourinary:  Positive for vaginal discharge. Negative for dysuria, flank pain, hematuria and pelvic pain.  Skin:  Negative for color change and rash.     Physical Exam Triage Vital Signs ED Triage Vitals  Encounter Vitals Group     BP 11/01/23 1216 135/81     Systolic BP Percentile --      Diastolic BP Percentile --      Pulse Rate 11/01/23 1216 (!) 105     Resp 11/01/23 1216 18     Temp 11/01/23 1216 97.8 F (36.6 C)     Temp src --      SpO2 11/01/23 1216 95 %     Weight --      Height --      Head Circumference --      Peak Flow --      Pain Score 11/01/23 1215 5     Pain Loc --      Pain Education --      Exclude from Growth Chart --    No  data found.  Updated Vital Signs BP 135/81   Pulse (!) 105   Temp 97.8 F (36.6 C)   Resp 18   SpO2 95%   Visual Acuity Right Eye Distance:   Left Eye Distance:   Bilateral Distance:    Right Eye Near:   Left Eye Near:    Bilateral Near:     Physical Exam Constitutional:      General: She is not in acute distress. HENT:     Mouth/Throat:     Mouth: Mucous membranes are moist.  Cardiovascular:     Rate and Rhythm: Normal rate and regular rhythm.  Pulmonary:     Effort: Pulmonary effort is normal. No respiratory distress.  Abdominal:     General: Bowel sounds are normal.     Palpations: Abdomen is soft.     Tenderness: There is no abdominal tenderness. There is no right CVA tenderness, left CVA tenderness, guarding or rebound.  Genitourinary:    Comments: Patient declines GU exam. Neurological:     Mental Status: She is alert.      UC Treatments / Results  Labs (all labs ordered are listed, but only abnormal results are displayed) Labs Reviewed  POCT URINALYSIS DIP (MANUAL ENTRY) - Abnormal; Notable for the following components:      Result Value   Blood, UA small (*)    All other components within normal limits  CERVICOVAGINAL ANCILLARY ONLY    EKG   Radiology No results found.  Procedures Procedures (including critical care time)  Medications Ordered in UC Medications - No data to display  Initial Impression / Assessment and Plan / UC Course  I have reviewed the triage vital signs and the nursing notes.  Pertinent labs & imaging results that were available during my care of the patient were reviewed by me and considered in my medical decision making (see chart for details).   Vaginal discharge, vaginal itching, STD screening.  Patient obtained self swab for testing.  Discussed that we will call if test results are positive.  Discussed that she may require treatment at that time.  Discussed that sexual partner(s) may also require treatment.   Instructed patient to abstain from sexual activity for at least 7 days.  Instructed her to follow-up with her PCP or gynecologist.  Patient agrees to plan of care.     Final Clinical Impressions(s) / UC Diagnoses   Final diagnoses:  Vaginal discharge  Vaginal itching  Screening for STD (sexually transmitted disease)     Discharge Instructions      Your vaginal tests are pending.  If your test results are positive, we will call you.  You and your sexual partner(s) may require treatment at that time.  Do not have sexual activity for at least 7 days.    Follow up with your primary care provider or gynecologst.          ED Prescriptions   None    PDMP not reviewed this encounter.   Mickie Bail, NP 11/01/23 1254

## 2023-11-04 LAB — CERVICOVAGINAL ANCILLARY ONLY
Bacterial Vaginitis (gardnerella): NEGATIVE
Candida Glabrata: NEGATIVE
Candida Vaginitis: NEGATIVE
Chlamydia: NEGATIVE
Comment: NEGATIVE
Comment: NEGATIVE
Comment: NEGATIVE
Comment: NEGATIVE
Comment: NEGATIVE
Comment: NORMAL
Neisseria Gonorrhea: NEGATIVE
Trichomonas: NEGATIVE

## 2023-12-22 ENCOUNTER — Other Ambulatory Visit: Payer: Self-pay

## 2023-12-22 ENCOUNTER — Ambulatory Visit
Admission: RE | Admit: 2023-12-22 | Discharge: 2023-12-22 | Disposition: A | Discharge status: Home / Self Care | Payer: Self-pay | Source: Ambulatory Visit | Attending: Emergency Medicine | Admitting: Emergency Medicine

## 2023-12-22 VITALS — BP 135/97 | HR 109 | Temp 99.0°F | Resp 20

## 2023-12-22 DIAGNOSIS — N898 Other specified noninflammatory disorders of vagina: Secondary | ICD-10-CM | POA: Diagnosis present

## 2023-12-22 DIAGNOSIS — R300 Dysuria: Secondary | ICD-10-CM | POA: Diagnosis present

## 2023-12-22 LAB — POCT URINALYSIS DIP (MANUAL ENTRY)
Bilirubin, UA: NEGATIVE
Glucose, UA: NEGATIVE mg/dL
Ketones, POC UA: NEGATIVE mg/dL
Leukocytes, UA: NEGATIVE
Nitrite, UA: NEGATIVE
Protein Ur, POC: NEGATIVE mg/dL
Spec Grav, UA: 1.01
Urobilinogen, UA: 0.2 U/dL
pH, UA: 6.5

## 2023-12-22 MED ORDER — METRONIDAZOLE 0.75 % VA GEL: 1.0000 | Freq: Every day | VAGINAL | 0 refills | Status: DC

## 2023-12-22 NOTE — ED Provider Notes (Signed)
 Catherine Waters    CSN: [id number] Arrival date & time: 12/22/23  4034      History   Chief Complaint Chief Complaint  Patient presents with   Back Pain    Entered by patient   Vaginal Discharge   Dizziness    HPI Catherine Waters is a 38 y.o. female.   Patient presents for evaluation of urinary frequency, dysuria, due to Christiann Hagerty vaginal discharge with odor, right lower back pain and left lower pelvic pain beginning 7 days ago.  Had occurrence of lightheadedness this morning.  Had 1 occurrence of vaginal itching which has resolved.  Has attempted use of ibuprofen.  History of a tubal ligation.  No known exposures.  Completed e-visit 1 day ago, prescribed Flexeril  and cephalexin , has not initiated treatment.   Past Medical History:  Diagnosis Date   Asthma    Hypertension    Protein C deficiency [facility])     Patient Active Problem List   Diagnosis Date Noted   Pelvic pressure in pregnancy, antepartum, third trimester 09/27/2018   Encounter for procreative genetic counseling 05/21/2018   Asthma affecting pregnancy in first trimester 05/13/2018   Chronic hypertension affecting pregnancy 05/13/2018   H/O chlamydia infection 05/13/2018   H/O suicide attempt 05/13/2018   Hx of preeclampsia, prior pregnancy, currently pregnant, first trimester 05/13/2018   Hypothyroid in pregnancy, antepartum, first trimester 05/13/2018   Rh negative state in antepartum period, first trimester 05/13/2018   Protein C deficiency affecting pregnancy (HCC) 05/13/2018   Personal history of other diseases of the female genital tract 05/13/2018   Supervision of other high risk pregnancies, first trimester 05/13/2018   Rh negative status during pregnancy 04/04/2018   History of depression 03/27/2018   Hypokalemia 03/14/2018   Microscopic hematuria 10/08/2017   Obstructive sleep apnea (adult) (pediatric) 12/17/2016   Migraine without aura and without status migrainosus, not intractable 12/11/2016    BMI 40.0-44.9, adult (HCC) 02/16/2016   Essential (primary) hypertension 08/02/2014   Mild intermittent asthma 06/10/2013   Thrombophilia (HCC) 07/31/2012    Past Surgical History:  Procedure Laterality Date   NO PAST SURGERIES     TUBAL LIGATION      OB History     Gravida  2   Para  1   Term      Preterm  1   AB      Living  1      SAB      IAB      Ectopic      Multiple      Live Births  1            Home Medications    Prior to Admission medications   Medication Sig Start Date End Date Taking? Authorizing Provider  metroNIDAZOLE  (METROGEL ) 0.75 % vaginal gel Place 1 Applicatorful vaginally at bedtime. 12/22/23  Yes Ceniyah Thorp R, NP  acetaZOLAMIDE  (DIAMOX ) 250 MG tablet Take 2 tablets (500 mg total) by mouth 2 (two) times daily. Patient not taking: Reported on 11/01/2023 08/07/22 09/06/22  Loman Risk, MD  albuterol  (VENTOLIN  HFA) 108 [phone] Base) MCG/ACT inhaler Inhale 1-2 puffs into the lungs every 6 (six) hours as needed for wheezing or shortness of breath. Patient not taking: Reported on 11/01/2023 02/01/22   Wellington Half, NP  amLODipine-olmesartan (AZOR) 5-20 MG tablet Take 1 tablet by mouth daily. 03/15/23 03/14/24 Yes [provider]  aspirin EC 81 MG tablet Take 81 mg by mouth daily. Patient not taking: Reported  on 11/01/2023    [provider]  benzonatate  (TESSALON ) 100 MG capsule Take 1 capsule (100 mg total) by mouth 3 (three) times daily as needed for cough. Patient not taking: Reported on 09/12/2022 02/01/22   Wellington Half, NP  buPROPion (WELLBUTRIN XL) 300 MG 24 hr tablet Take by mouth.   Yes [provider]  cyclobenzaprine  (FLEXERIL ) 10 MG tablet Take 1 tablet (10 mg total) by mouth at bedtime. Patient not taking: Reported on 11/01/2023 10/12/23   Reena Canning, NP  EMGALITY 120 MG/ML SOAJ Inject into the skin. 09/09/23  Yes [provider]  famotidine  (PEPCID ) 20 MG tablet Take by mouth. Patient not taking:  Reported on 11/01/2023    [provider]  ferrous sulfate 325 (65 FE) MG EC tablet Take 1 tablet by mouth every other day. 07/31/23  Yes [provider]  ibuprofen (ADVIL) 600 MG tablet Take 600 mg by mouth every 6 (six) hours as needed. Patient not taking: Reported on 11/01/2023 06/19/21   [provider]  levonorgestrel (MIRENA) 20 MCG/DAY IUD 1 each by Intrauterine route once. Patient not taking: Reported on 11/01/2023    [provider]  loratadine (CLARITIN) 10 MG tablet Take 10 mg by mouth daily. Patient not taking: Reported on 11/01/2023    [provider]  melatonin 3 MG TABS tablet Take by mouth. 03/12/18  Yes [provider]  metroNIDAZOLE  (FLAGYL ) 500 MG tablet Take 1 tablet (500 mg total) by mouth 2 (two) times daily. Patient not taking: Reported on 11/01/2023 11/14/22   Corine Dice, MD  moxifloxacin  (VIGAMOX ) 0.5 % ophthalmic solution Place 1 drop into both eyes 3 (three) times daily. Patient not taking: Reported on 11/01/2023 10/12/23   Reena Canning, NP  NIFEdipine (ADALAT CC) 60 MG 24 hr tablet Take 60 mg by mouth daily. Patient not taking: Reported on 11/01/2023    [provider]  nitrofurantoin , macrocrystal-monohydrate, (MACROBID ) 100 MG capsule Take 1 capsule (100 mg total) by mouth 2 (two) times daily. Patient not taking: Reported on 11/01/2023 10/12/23   Reena Canning, NP  ondansetron  (ZOFRAN  ODT) 4 MG disintegrating tablet Take 1 tablet (4 mg total) by mouth every 8 (eight) hours as needed for nausea or vomiting. Patient not taking: Reported on 11/01/2023 09/27/18   Sung, Jade J, MD  OZEMPIC, 0.25 OR 0.5 MG/DOSE, 2 MG/1.5ML SOPN Inject into the skin. Patient not taking: Reported on 11/01/2023 08/03/21   [provider]  potassium chloride  SA (KLOR-CON  M) 20 MEQ tablet  09/03/20   [provider]  predniSONE  (DELTASONE ) 20 MG tablet Take 2 tablets (40 mg total) by mouth daily. Patient not taking: Reported  on 11/01/2023 10/12/23   Reena Canning, NP  Prenatal Vit-Fe Fumarate-FA (MULTIVITAMIN-PRENATAL) 27-0.8 MG TABS tablet Take 1 tablet by mouth daily at 12 noon. Patient not taking: Reported on 09/12/2022    [provider]  rizatriptan (MAXALT) 10 MG tablet Take by mouth. Patient not taking: Reported on 11/01/2023 07/29/22   [provider]  sertraline (ZOLOFT) 50 MG tablet Take 1 tablet by mouth daily. 03/15/20   [provider]  SUMAtriptan (IMITREX) 50 MG tablet SMARTSIG:1 Tablet(s) By Mouth Patient not taking: Reported on 11/01/2023 01/29/22   [provider]  UBRELVY 100 MG TABS Take by mouth. 10/18/23  Yes [provider]  Vitamin D, Ergocalciferol, (DRISDOL) 1.25 MG (50000 UNIT) CAPS capsule Take 50,000 Units by mouth once a week. 08/26/23   [provider]    Family History History reviewed. No pertinent family history.  Social History Social History   Tobacco Use   Smoking status: Never   Smokeless tobacco: Never  Vaping Use   Vaping status: Never Used  Substance Use Topics   Alcohol use: Not Currently   Drug use: Never     Allergies   Minocycline   Review of Systems Review of Systems   Physical Exam Triage Vital Signs ED Triage Vitals  Encounter Vitals Group     BP 12/22/23 1008 (!) 135/97     Systolic BP Percentile --      Diastolic BP Percentile --      Pulse Rate 12/22/23 1008 (!) 109     Resp 12/22/23 0957 16     Temp 12/22/23 1008 99 F (37.2 C)     Temp Source 12/22/23 0957 Oral     SpO2 12/22/23 1008 99 %     Weight --      Height --      Head Circumference --      Peak Flow --      Pain Score 12/22/23 1004 5     Pain Loc --      Pain Education --      Exclude from Growth Chart --    No data found.  Updated Vital Signs BP (!) 135/97   Pulse (!) 109   Temp 99 F (37.2 C)   Resp 20   LMP 11/30/2023 (Approximate)   SpO2 99%   Visual Acuity Right Eye Distance:   Left Eye Distance:    Bilateral Distance:    Right Eye Near:   Left Eye Near:    Bilateral Near:     Physical Exam Constitutional:      Appearance: Normal appearance.  Eyes:     Extraocular Movements: Extraocular movements intact.  Pulmonary:     Effort: Pulmonary effort is normal.  Abdominal:     Tenderness: There is abdominal tenderness in the suprapubic area. There is no right CVA tenderness or left CVA tenderness.  Neurological:     Mental Status: She is alert and oriented to person, place, and time.      UC Treatments / Results  Labs (all labs ordered are listed, but only abnormal results are displayed) Labs Reviewed  POCT URINALYSIS DIP (MANUAL ENTRY) - Abnormal; Notable for the following components:      Result Value   Color, UA light yellow (*)    Blood, UA small (*)    All other components within normal limits  URINE CULTURE  CERVICOVAGINAL ANCILLARY ONLY    EKG   Radiology No results found.  Procedures Procedures (including critical care time)  Medications Ordered in UC Medications - No data to display  Initial Impression / Assessment and Plan / UC Course  I have reviewed the triage vital signs and the nursing notes.  Pertinent labs & imaging results that were available during my care of the patient were reviewed by me and considered in my medical decision making (see chart for details).  Dysuria, vaginal discharge  Urinalysis negative, sent for culture, discussed findings with patient, no CVA tenderness but suprapubic pain on exam, etiology most likely related to vaginal discharge, based on symptomology and description of discharge providing coverage for bacterial vaginosis with MetroGel , discussed administration, advised patient to hold off on picking up prescription of cephalexin , may use Flexeril , discussed supportive measures and advised follow-up with urgent care as needed Final Clinical  Impressions(s) / UC Diagnoses   Final diagnoses:  Dysuria   Discharge  Instructions      Today you are being treated prophylactically for  Bacterial vaginosis   Urinalysis is negative for bladder infection, has been sent to the lab to see if bacteria will grow, you will be notified if this occurs, do not pick up cephalexin  from the pharmacy  May pick up Flexeril  for management of back pain which is most likely related to vaginal infection  Use MetroGel  every night before bed for 7 days  Bacterial vaginosis which results from an overgrowth of one on several organisms that are normally present in your vagina. Vaginosis is an inflammation of the vagina that can result in discharge, itching and pain.  Labs pending 2-3 days, you will be contacted if positive for any sti and treatment will be sent to the pharmacy, you will have to return to the clinic if positive for gonorrhea to receive treatment   Please refrain from having sex until labs results, if positive please refrain from having sex until treatment complete and symptoms resolve   If positive for , Chlamydia  gonorrhea or trichomoniasis please notify partner or partners so they may tested as well  Moving forward, it is recommended you use some form of protection against the transmission of sti infections  such as condoms or dental dams with each sexual encounter     In addition: Avoid baths, hot tubs and whirlpool spas.  Don't use scented or harsh soaps Avoid irritants. These include scented tampons and pads. Wipe from front to back after using the toilet. Don't douche. Your vagina doesn't require cleansing other than normal bathing.  Use a condom.  Wear cotton underwear, this fabric absorbs some moisture.       ED Prescriptions     Medication Sig Dispense Auth. Provider   metroNIDAZOLE  (METROGEL ) 0.75 % vaginal gel Place 1 Applicatorful vaginally at bedtime. 70 g Reena Canning, NP      PDMP not reviewed this encounter.   Reena Canning, NP 12/22/23 1027

## 2023-12-22 NOTE — Discharge Instructions (Addendum)
 Today you are being treated prophylactically for  Bacterial vaginosis   Urinalysis is negative for bladder infection, has been sent to the lab to see if bacteria will grow, you will be notified if this occurs, do not pick up cephalexin  from the pharmacy  May pick up Flexeril  for management of back pain which is most likely related to vaginal infection  Use MetroGel  every night before bed for 7 days  Bacterial vaginosis which results from an overgrowth of one on several organisms that are normally present in your vagina. Vaginosis is an inflammation of the vagina that can result in discharge, itching and pain.  Labs pending 2-3 days, you will be contacted if positive for any sti and treatment will be sent to the pharmacy, you will have to return to the clinic if positive for gonorrhea to receive treatment   Please refrain from having sex until labs results, if positive please refrain from having sex until treatment complete and symptoms resolve   If positive for , Chlamydia  gonorrhea or trichomoniasis please notify partner or partners so they may tested as well  Moving forward, it is recommended you use some form of protection against the transmission of sti infections  such as condoms or dental dams with each sexual encounter     In addition: Avoid baths, hot tubs and whirlpool spas.  Don't use scented or harsh soaps Avoid irritants. These include scented tampons and pads. Wipe from front to back after using the toilet. Don't douche. Your vagina doesn't require cleansing other than normal bathing.  Use a condom.  Wear cotton underwear, this fabric absorbs some moisture.

## 2023-12-22 NOTE — ED Triage Notes (Signed)
 PT reports she has a vag. Discharge and lower back pain. This morning Pt felt lightheaded. Pt also has dysuria and frequency.

## 2023-12-23 LAB — URINE CULTURE

## 2023-12-24 ENCOUNTER — Ambulatory Visit: Payer: Self-pay

## 2023-12-24 LAB — CERVICOVAGINAL ANCILLARY ONLY
Bacterial Vaginitis (gardnerella): NEGATIVE
Candida Glabrata: NEGATIVE
Candida Vaginitis: NEGATIVE
Chlamydia: NEGATIVE
Comment: NEGATIVE
Comment: NEGATIVE
Comment: NEGATIVE
Comment: NEGATIVE
Comment: NEGATIVE
Comment: NORMAL
Neisseria Gonorrhea: NEGATIVE
Trichomonas: NEGATIVE

## 2024-02-27 ENCOUNTER — Emergency Department
Admission: EM | Admit: 2024-02-27 | Discharge: 2024-02-27 | Disposition: A | Discharge status: Home / Self Care | Attending: Emergency Medicine | Admitting: Emergency Medicine

## 2024-02-27 ENCOUNTER — Other Ambulatory Visit: Payer: Self-pay

## 2024-02-27 ENCOUNTER — Emergency Department

## 2024-02-27 DIAGNOSIS — R000 Tachycardia, unspecified: Secondary | ICD-10-CM | POA: Diagnosis not present

## 2024-02-27 DIAGNOSIS — R791 Abnormal coagulation profile: Secondary | ICD-10-CM | POA: Insufficient documentation

## 2024-02-27 DIAGNOSIS — R7989 Other specified abnormal findings of blood chemistry: Secondary | ICD-10-CM | POA: Diagnosis present

## 2024-02-27 LAB — CBC
HCT: 36.8 % (ref 36.0–46.0)
Hemoglobin: 12.3 g/dL (ref 12.0–15.0)
MCH: 29.1 pg (ref 26.0–34.0)
MCHC: 33.4 g/dL (ref 30.0–36.0)
MCV: 87 fL (ref 80.0–100.0)
Platelets: 383 K/uL (ref 150–400)
RBC: 4.23 MIL/uL (ref 3.87–5.11)
RDW: 12.5 % (ref 11.5–15.5)
WBC: 8.1 K/uL (ref 4.0–10.5)
nRBC: 0 % (ref 0.0–0.2)

## 2024-02-27 LAB — TROPONIN I (HIGH SENSITIVITY): Troponin I (High Sensitivity): 4 ng/L (ref <18)

## 2024-02-27 LAB — BASIC METABOLIC PANEL WITH GFR
Anion gap: 9 (ref 5–15)
BUN: 9 mg/dL (ref 6–20)
CO2: 24 mmol/L (ref 22–32)
Calcium: 9.3 mg/dL (ref 8.9–10.3)
Chloride: 107 mmol/L (ref 98–111)
Creatinine, Ser: 0.83 mg/dL (ref 0.44–1.00)
GFR, Estimated: >60 mL/min (ref >60)
Glucose, Bld: 86 mg/dL (ref 70–99)
Potassium: 3.9 mmol/L (ref 3.5–5.1)
Sodium: 140 mmol/L (ref 135–145)

## 2024-02-27 MED ORDER — IOHEXOL 350 MG/ML SOLN
100.0000 mL | Freq: Once | INTRAVENOUS | Status: AC | PRN
  Administered 2024-02-27: 100 mL via INTRAVENOUS

## 2024-02-27 NOTE — ED Provider Notes (Signed)
 [facility] Provider Note    Event Date/Time   First MD Initiated Contact with Patient 02/27/24 2106     (approximate)   History   Abnormal Lab   HPI  Catherine Waters is a 38 y.o. female who presents to the ED for evaluation of Abnormal Lab   I reviewed PCP visit from earlier today where patient was reevaluated for sinus tachycardia that has been noted a few times as an outpatient recently.  She has been referred to cardiology.  PCP today sent had a D-dimer that returns elevated so she sent to the ED to assess for PE.  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  Here in the ED, patient reports feeling fine and has no complaints.  No chest pain or syncopal episodes.  No leg swelling, cramping   Physical Exam   Triage Vital Signs: ED Triage Vitals  Encounter Vitals Group     BP 02/27/24 1858 (!) 162/136     Girls Systolic BP Percentile --      Girls Diastolic BP Percentile --      Boys Systolic BP Percentile --      Boys Diastolic BP Percentile --      Pulse Rate 02/27/24 1858 (!) 106     Resp 02/27/24 1858 16     Temp 02/27/24 1858 98.9 F (37.2 C)     Temp Source 02/27/24 1858 Oral     SpO2 02/27/24 1858 94 %     Weight 02/27/24 1851 246 lb (111.6 kg)     Height 02/27/24 1851 5' 3 (1.6 m)     Head Circumference --      Peak Flow --      Pain Score 02/27/24 1851 0     Pain Loc --      Pain Education --      Exclude from Growth Chart --     Most recent vital signs: Vitals:   02/27/24 1858  BP: (!) 162/136  Pulse: (!) 106  Resp: 16  Temp: 98.9 F (37.2 C)  SpO2: 94%    General: Awake, no distress.  CV:  Good peripheral perfusion.  Resp:  Normal effort.  Abd:  No distention.  MSK:  No deformity noted.  Neuro:  No focal deficits appreciated. Other:     ED Results / Procedures / Treatments   Labs (all labs ordered are listed, but only abnormal results are displayed) Labs  Reviewed  BASIC METABOLIC PANEL WITH GFR  CBC  POC URINE PREG, ED  TROPONIN I (HIGH SENSITIVITY)  TROPONIN I (HIGH SENSITIVITY)    EKG Sinus tachycardia with a rate of 112 bpm.  Inverted T waves to lead III.  No further signs of acute ischemia.  RADIOLOGY CXR interpreted by me without evidence of acute cardiopulmonary pathology. CTA chest interpreted by me without signs of PE  Official radiology report(s): CT Angio Chest PE W and/or Wo Contrast Result Date: 02/27/2024 CLINICAL DATA:  Positive D-dimer.  Tachycardia. EXAM: CT ANGIOGRAPHY CHEST WITH CONTRAST TECHNIQUE: Multidetector CT imaging of the chest was performed using the standard protocol during bolus administration of intravenous contrast. Multiplanar CT image reconstructions and MIPs were obtained to evaluate the vascular anatomy. RADIATION DOSE REDUCTION: This exam was performed according to the departmental dose-optimization program which includes automated exposure control, adjustment of the mA and/or kV according to patient size and/or use of iterative reconstruction technique. CONTRAST:  OMNIPAQUE  IOHEXOL  350  MG/ML SOLN COMPARISON:  None Available. FINDINGS: Cardiovascular: Satisfactory opacification of the pulmonary arteries to the segmental level. No evidence of pulmonary embolism. Normal heart size. No pericardial effusion. Aberrant right subclavian artery present. Mediastinum/Nodes: No enlarged mediastinal, hilar, or axillary lymph nodes. Thyroid gland, trachea, and esophagus demonstrate no significant findings. Lungs/Pleura: Lungs are clear. No pleural effusion or pneumothorax. Upper Abdomen: No acute abnormality. Musculoskeletal: No chest wall abnormality. No acute or significant osseous findings. Review of the MIP images confirms the above findings. IMPRESSION: 1. No evidence of pulmonary embolism or other acute intrathoracic process. 2. Aberrant right subclavian artery. Electronically Signed   By: Greig Pique M.D.   On:  02/27/2024 20:09   DG Chest 2 View Result Date: 02/27/2024 CLINICAL DATA:  Shortness of breath and known tachycardia EXAM: CHEST - 2 VIEW COMPARISON:  04/23/2020 FINDINGS: The heart size and mediastinal contours are within normal limits. Both lungs are clear. The visualized skeletal structures are unremarkable. IMPRESSION: No active cardiopulmonary disease. Electronically Signed   By: Oneil Devonshire M.D.   On: 02/27/2024 19:20    PROCEDURES and INTERVENTIONS:  .1-3 Lead EKG Interpretation  Performed by: Claudene Rover, MD Authorized by: Claudene Rover, MD     Interpretation: abnormal     ECG rate:  110   ECG rate assessment: tachycardic     Rhythm: sinus tachycardia     Ectopy: none     Conduction: normal     Medications  iohexol  (OMNIPAQUE ) 350 MG/ML injection 100 mL (100 mLs Intravenous Contrast Given 02/27/24 2000)     IMPRESSION / MDM / ASSESSMENT AND PLAN / ED COURSE  I reviewed the triage vital signs and the nursing notes.  Differential diagnosis includes, but is not limited to, ACS, PTX, PNA, muscle strain/spasm, PE, dissection, anxiety, pleural effusion  {Patient presents with symptoms of an acute illness or injury that is potentially life-threatening.  Patient sent to the ED due to sinus tachycardia, positive D-dimer for PE rule out, negative CTA chest and suitable for outpatient management.  She is asymptomatic with sinus tachycardia in the low 100s.  Nonischemic EKG, negative troponin, normal metabolic panel and CBC.  Reassuring imaging.  No symptoms of DVT.  Suitable for outpatient management.  Has already been referred to cardiology and started on metoprolol.      FINAL CLINICAL IMPRESSION(S) / ED DIAGNOSES   Final diagnoses:  Positive D dimer  Sinus tachycardia     Rx / DC Orders   ED Discharge Orders     None        Note:  This document was prepared using Dragon voice recognition software and may include unintentional dictation errors.   Claudene Rover,  MD 02/27/24 2137

## 2024-02-27 NOTE — Discharge Instructions (Signed)
 No signs of blood clots

## 2024-02-27 NOTE — ED Triage Notes (Signed)
 Has been followed at [facility] for fast heart rate.  Wore monitor for 7 days -- results showed sinus tachycardia.  Blood work drawn  and D dimer was elevated  States SOB x 1 when walking today.  AAOx3. Skin warm and dry. No SOB/ DOE.  NAD

## 2024-02-27 NOTE — Result Encounter Note (Signed)
 Case discussed with Dr. Giacomo.  Called patient and discussed need to go to ED c/f PE vs. DVT. Patient agreed to present to [facility] ED as patient lives in [place].

## 2024-03-04 ENCOUNTER — Encounter: Payer: Self-pay | Admitting: Emergency Medicine

## 2024-03-04 ENCOUNTER — Ambulatory Visit
Admission: EM | Admit: 2024-03-04 | Discharge: 2024-03-04 | Disposition: A | Discharge status: Home / Self Care | Payer: Self-pay | Attending: Emergency Medicine | Admitting: Emergency Medicine

## 2024-03-04 DIAGNOSIS — M79602 Pain in left arm: Secondary | ICD-10-CM

## 2024-03-04 MED ORDER — PREDNISONE 20 MG PO TABS: 40.0000 mg | ORAL_TABLET | Freq: Every day | ORAL | 0 refills | Status: AC

## 2024-03-04 NOTE — Discharge Instructions (Addendum)
 Your pain is most likely caused by irritation to the muscles, tingling to your hand is most likely due to nerve compression and should improve as pain improves  Begin use of ibuprofen taking 600 to 800 mg every 6-8 hours consistently for 2 to 3 days then you may use as needed, may take Tylenol or any topical medicines additionally  If ineffective in managing your pain you may stop use of all ibuprofen and initiate prednisone  every morning with food which helps with inflammation and pain as well  You may use heating pad in 15 minute intervals as needed for additional comfort, within the first 2-3 days you may find comfort in using ice in 10-15 minutes over affected area  Begin massaging and/or stretching affected area daily for 10 minutes as tolerated to further loosen muscles   When sitting or lying down place pillow underneath the arm and behind back  If pain persist after recommended treatment or reoccurs if may be beneficial to follow up with orthopedic specialist for evaluation, this doctor specializes in the bones and can manage your symptoms long-term with options such as but not limited to imaging, medications or physical therapy
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
## 2024-05-19 ENCOUNTER — Encounter: Payer: Self-pay | Admitting: Emergency Medicine

## 2024-05-19 ENCOUNTER — Ambulatory Visit
Admission: EM | Admit: 2024-05-19 | Discharge: 2024-05-19 | Disposition: A | Discharge status: Home / Self Care | Attending: Emergency Medicine | Admitting: Emergency Medicine

## 2024-05-19 DIAGNOSIS — N898 Other specified noninflammatory disorders of vagina: Secondary | ICD-10-CM | POA: Insufficient documentation

## 2024-05-19 DIAGNOSIS — R300 Dysuria: Secondary | ICD-10-CM | POA: Insufficient documentation

## 2024-05-19 LAB — POCT URINE DIPSTICK
Glucose, UA: NEGATIVE mg/dL
Leukocytes, UA: NEGATIVE
Nitrite, UA: NEGATIVE
POC PROTEIN,UA: 30 — AB
Spec Grav, UA: 1.02 (ref 1.010–1.025)
Urobilinogen, UA: 0.2 U/dL
pH, UA: 6 (ref 5.0–8.0)

## 2024-05-19 MED ORDER — METRONIDAZOLE 0.75 % VA GEL: 1.0000 | Freq: Every day | VAGINAL | 0 refills | Status: AC

## 2024-05-19 MED ORDER — FLUCONAZOLE 150 MG PO TABS: 150.0000 mg | ORAL_TABLET | ORAL | 0 refills | Status: AC

## 2024-05-19 NOTE — ED Provider Notes (Signed)
 CAY RALPH PELT    CSN: [id number] Arrival date & time: 05/19/24  1156      History   Chief Complaint Chief Complaint  Patient presents with   Vaginal Discharge   Dysuria    HPI Nayelis Bonito is a 38 y.o. female.   Patient presents for evaluation of vaginal discharge with itching and odor present for 7 days, over the past 3 days has begun to experience dysuria with and without urination.  Unsure of presence of urinary frequency as she endorses she has an increase fluid intake with water.  Denies abdominal or flank pain, hematuria.  Has not attempted treatment.  Past Medical History:  Diagnosis Date   Asthma    Hypertension    Protein C deficiency     Patient Active Problem List   Diagnosis Date Noted   Pelvic pressure in pregnancy, antepartum, third trimester 09/27/2018   Encounter for procreative genetic counseling 05/21/2018   Asthma affecting pregnancy in first trimester 05/13/2018   Chronic hypertension affecting pregnancy 05/13/2018   H/O chlamydia infection 05/13/2018   H/O suicide attempt 05/13/2018   Hx of preeclampsia, prior pregnancy, currently pregnant, first trimester 05/13/2018   Hypothyroid in pregnancy, antepartum, first trimester 05/13/2018   Rh negative state in antepartum period, first trimester 05/13/2018   Protein C deficiency affecting pregnancy 05/13/2018   Personal history of other diseases of the female genital tract 05/13/2018   Supervision of other high risk pregnancies, first trimester 05/13/2018   Rh negative status during pregnancy 04/04/2018   History of depression 03/27/2018   Hypokalemia 03/14/2018   Microscopic hematuria 10/08/2017   Obstructive sleep apnea (adult) (pediatric) 12/17/2016   Migraine without aura and without status migrainosus, not intractable 12/11/2016   BMI 40.0-44.9, adult (HCC) 02/16/2016   Essential (primary) hypertension 08/02/2014   Mild intermittent asthma 06/10/2013   Thrombophilia 07/31/2012     Past Surgical History:  Procedure Laterality Date   NO PAST SURGERIES     TUBAL LIGATION      OB History     Gravida  2   Para  1   Term      Preterm  1   AB      Living  1      SAB      IAB      Ectopic      Multiple      Live Births  1            Home Medications    Prior to Admission medications   Medication Sig Start Date End Date Taking? Authorizing Provider  fluconazole  (DIFLUCAN ) 150 MG tablet Take 1 tablet (150 mg total) by mouth once a week for 2 doses. 05/19/24 05/27/24 Yes Elson Ulbrich R, NP  metroNIDAZOLE  (METROGEL ) 0.75 % vaginal gel Place 1 Applicatorful vaginally at bedtime. 05/19/24  Yes Areana Kosanke R, NP  acetaZOLAMIDE  (DIAMOX ) 250 MG tablet Take 2 tablets (500 mg total) by mouth 2 (two) times daily. Patient not taking: Reported on 11/01/2023 08/07/22 09/06/22  Angelena Smalls, MD  albuterol  (VENTOLIN  HFA) 108 [phone] Base) MCG/ACT inhaler Inhale 1-2 puffs into the lungs every 6 (six) hours as needed for wheezing or shortness of breath. Patient not taking: Reported on 11/01/2023 02/01/22   Corlis Burnard DEL, NP  amLODipine-olmesartan (AZOR) 5-20 MG tablet Take 1 tablet by mouth daily. 03/15/23 03/14/24  [provider]  aspirin EC 81 MG tablet Take 81 mg by mouth daily. Patient not taking: Reported on 11/01/2023  [provider]  benzonatate  (TESSALON ) 100 MG capsule Take 1 capsule (100 mg total) by mouth 3 (three) times daily as needed for cough. Patient not taking: Reported on 09/12/2022 02/01/22   Corlis Burnard DEL, NP  buPROPion (WELLBUTRIN XL) 300 MG 24 hr tablet Take by mouth.    [provider]  cyclobenzaprine  (FLEXERIL ) 10 MG tablet Take 1 tablet (10 mg total) by mouth at bedtime. Patient not taking: Reported on 11/01/2023 10/12/23   Teresa Shelba SAUNDERS, NP  EMGALITY 120 MG/ML SOAJ Inject into the skin. 09/09/23   [provider]  famotidine  (PEPCID ) 20 MG tablet Take by mouth. Patient not taking: Reported on 11/01/2023     [provider]  ferrous sulfate 325 (65 FE) MG EC tablet Take 1 tablet by mouth every other day. 07/31/23   [provider]  ibuprofen (ADVIL) 600 MG tablet Take 600 mg by mouth every 6 (six) hours as needed. Patient not taking: Reported on 11/01/2023 06/19/21   [provider]  levonorgestrel (MIRENA) 20 MCG/DAY IUD 1 each by Intrauterine route once. Patient not taking: Reported on 11/01/2023    [provider]  loratadine (CLARITIN) 10 MG tablet Take 10 mg by mouth daily. Patient not taking: Reported on 11/01/2023    [provider]  melatonin 3 MG TABS tablet Take by mouth. 03/12/18   [provider]  metroNIDAZOLE  (FLAGYL ) 500 MG tablet Take 1 tablet (500 mg total) by mouth 2 (two) times daily. Patient not taking: Reported on 11/01/2023 11/14/22   Blaise Aleene KIDD, MD  moxifloxacin  (VIGAMOX ) 0.5 % ophthalmic solution Place 1 drop into both eyes 3 (three) times daily. Patient not taking: Reported on 11/01/2023 10/12/23   Teresa Shelba SAUNDERS, NP  NIFEdipine (ADALAT CC) 60 MG 24 hr tablet Take 60 mg by mouth daily. Patient not taking: Reported on 11/01/2023    [provider]  nitrofurantoin , macrocrystal-monohydrate, (MACROBID ) 100 MG capsule Take 1 capsule (100 mg total) by mouth 2 (two) times daily. Patient not taking: Reported on 11/01/2023 10/12/23   Teresa Shelba SAUNDERS, NP  ondansetron  (ZOFRAN  ODT) 4 MG disintegrating tablet Take 1 tablet (4 mg total) by mouth every 8 (eight) hours as needed for nausea or vomiting. Patient not taking: Reported on 11/01/2023 09/27/18   Sung, Jade J, MD  OZEMPIC, 0.25 OR 0.5 MG/DOSE, 2 MG/1.5ML SOPN Inject into the skin. Patient not taking: Reported on 11/01/2023 08/03/21   [provider]  potassium chloride  SA (KLOR-CON  M) 20 MEQ tablet  09/03/20   [provider]  predniSONE  (DELTASONE ) 20 MG tablet Take 2 tablets (40 mg total) by mouth daily. 03/04/24   Davisha Linthicum, Shelba SAUNDERS, NP  Prenatal Vit-Fe  Fumarate-FA (MULTIVITAMIN-PRENATAL) 27-0.8 MG TABS tablet Take 1 tablet by mouth daily at 12 noon. Patient not taking: Reported on 09/12/2022    [provider]  rizatriptan (MAXALT) 10 MG tablet Take by mouth. Patient not taking: Reported on 11/01/2023 07/29/22   [provider]  sertraline (ZOLOFT) 50 MG tablet Take 1 tablet by mouth daily. 03/15/20   [provider]  SUMAtriptan (IMITREX) 50 MG tablet SMARTSIG:1 Tablet(s) By Mouth Patient not taking: Reported on 11/01/2023 01/29/22   [provider]  UBRELVY 100 MG TABS Take by mouth. 10/18/23   [provider]  Vitamin D, Ergocalciferol, (DRISDOL) 1.25 MG (50000 UNIT) CAPS capsule Take 50,000 Units by mouth once a week. 08/26/23   [provider]    Family History History reviewed. No pertinent family  history.  Social History Social History   Tobacco Use   Smoking status: Never   Smokeless tobacco: Never  Vaping Use   Vaping status: Never Used  Substance Use Topics   Alcohol use: Not Currently   Drug use: Never     Allergies   Minocycline   Review of Systems Review of Systems   Physical Exam Triage Vital Signs ED Triage Vitals  Encounter Vitals Group     BP 05/19/24 1223 117/83     Girls Systolic BP Percentile --      Girls Diastolic BP Percentile --      Boys Systolic BP Percentile --      Boys Diastolic BP Percentile --      Pulse Rate 05/19/24 1223 96     Resp 05/19/24 1223 18     Temp 05/19/24 1223 98.9 F (37.2 C)     Temp Source 05/19/24 1223 Oral     SpO2 05/19/24 1223 98 %     Weight --      Height --      Head Circumference --      Peak Flow --      Pain Score 05/19/24 1227 0     Pain Loc --      Pain Education --      Exclude from Growth Chart --    No data found.  Updated Vital Signs BP 117/83 (BP Location: Left Arm)   Pulse 96   Temp 98.9 F (37.2 C) (Oral)   Resp 18   LMP 05/10/2024 (Exact Date)   SpO2 98%   Visual Acuity Right Eye  Distance:   Left Eye Distance:   Bilateral Distance:    Right Eye Near:   Left Eye Near:    Bilateral Near:     Physical Exam Constitutional:      Appearance: Normal appearance.  Eyes:     Extraocular Movements: Extraocular movements intact.  Pulmonary:     Effort: Pulmonary effort is normal.  Abdominal:     Tenderness: There is no abdominal tenderness. There is no right CVA tenderness, left CVA tenderness or guarding.  Genitourinary:    Comments: deferred Neurological:     Mental Status: She is alert and oriented to person, place, and time.      UC Treatments / Results  Labs (all labs ordered are listed, but only abnormal results are displayed) Labs Reviewed  URINE CULTURE  POCT URINE DIPSTICK  CERVICOVAGINAL ANCILLARY ONLY    EKG   Radiology No results found.  Procedures Procedures (including critical care time)  Medications Ordered in UC Medications - No data to display  Initial Impression / Assessment and Plan / UC Course  I have reviewed the triage vital signs and the nursing notes.  Pertinent labs & imaging results that were available during my care of the patient were reviewed by me and considered in my medical decision making (see chart for details).  Dysuria, vaginal discharge  Treating empirically for bacterial vaginosis and yeast based on history and description of symptoms, prescribed MetroGel  and Diflucan  and discussed administration, STI panel pending will treat further per protocol, urinalysis negative, sent for culture will initiate antibiotics based on culture results, discussed with patient, no concern for pregnancy history of tubal ligation, recommended nonpharmacological supportive care with follow-up as needed Final Clinical Impressions(s) / UC Diagnoses   Final diagnoses:  Dysuria     Discharge Instructions      AMA treated for bacterial vaginosis and  yeast  Take metronidazole  gel every night before bed for 7 days  Take 1  Diflucan  tablet today and then take second dose after completion of all MetroGel  in 1 week  Vaginal swab checking for yeast gonorrhea chlamydia bacterial vaginosis and trichomoniasis pending for 2 to 3 days and you will be notified of positive test results only and additional treatment sent to pharmacy if needed  Urinalysis does not show a bladder infection, urine sample has been sent to the lab for the next 3 days to determine if bacteria is truly present, if this occurs you will be notified and additional medicine sent to the form  Please refrain from any sexual activity during treatment and until all labs have resulted   In addition:   Avoid baths, hot tubs and whirlpool spas.  Don't use scented or harsh soaps, such as those with deodorant or antibacterial action. Avoid irritants. These include scented tampons and pads. Wipe from front to back after using the toilet.  Don't douche. Your vagina doesn't require cleansing other than normal bathing.  Use a  condom. Wear cotton underwear, this fabric helps absorb moisture     ED Prescriptions     Medication Sig Dispense Auth. Provider   metroNIDAZOLE  (METROGEL ) 0.75 % vaginal gel Place 1 Applicatorful vaginally at bedtime. 70 g Allayah Raineri, Shelba R, NP   fluconazole  (DIFLUCAN ) 150 MG tablet Take 1 tablet (150 mg total) by mouth once a week for 2 doses. 2 tablet Asad Keeven R, NP      PDMP not reviewed this encounter.   Teresa Shelba SAUNDERS, NP 05/19/24 1259

## 2024-05-19 NOTE — ED Triage Notes (Signed)
 Patient reports white vaginal discharge with itching x 1 week. Patient complains burning sensation when urinating x 3 days. Patient has not taken anything for symptoms.

## 2024-05-19 NOTE — Discharge Instructions (Signed)
 AMA treated for bacterial vaginosis and yeast  Take metronidazole  gel every night before bed for 7 days  Take 1 Diflucan  tablet today and then take second dose after completion of all MetroGel  in 1 week  Vaginal swab checking for yeast gonorrhea chlamydia bacterial vaginosis and trichomoniasis pending for 2 to 3 days and you will be notified of positive test results only and additional treatment sent to pharmacy if needed  Urinalysis does not show a bladder infection, urine sample has been sent to the lab for the next 3 days to determine if bacteria is truly present, if this occurs you will be notified and additional medicine sent to the form  Please refrain from any sexual activity during treatment and until all labs have resulted   In addition:   Avoid baths, hot tubs and whirlpool spas.  Don't use scented or harsh soaps, such as those with deodorant or antibacterial action. Avoid irritants. These include scented tampons and pads. Wipe from front to back after using the toilet.  Don't douche. Your vagina doesn't require cleansing other than normal bathing.  Use a  condom. Wear cotton underwear, this fabric helps absorb moisture

## 2024-05-20 ENCOUNTER — Ambulatory Visit (HOSPITAL_COMMUNITY): Payer: Self-pay

## 2024-05-20 LAB — CERVICOVAGINAL ANCILLARY ONLY
Bacterial Vaginitis (gardnerella): POSITIVE — AB
Candida Glabrata: NEGATIVE
Candida Vaginitis: NEGATIVE
Chlamydia: NEGATIVE
Comment: NEGATIVE
Comment: NEGATIVE
Comment: NEGATIVE
Comment: NEGATIVE
Comment: NEGATIVE
Comment: NORMAL
Neisseria Gonorrhea: NEGATIVE
Trichomonas: NEGATIVE

## 2024-05-20 LAB — URINE CULTURE: Culture: NO GROWTH

## 2024-05-20 MED ORDER — METRONIDAZOLE 500 MG PO TABS
500.0000 mg | ORAL_TABLET | Freq: Two times a day (BID) | ORAL | 0 refills | Status: AC
Start: 2024-05-20 — End: ?
